# Patient Record
Sex: Female | Born: 1985 | Race: White | Hispanic: No | Smoking: Current every day smoker
Health system: Southern US, Community
[De-identification: ages and names within clinical notes are randomized; demographics above are authoritative.]

## PROBLEM LIST (undated history)

## (undated) HISTORY — PX: TUBAL LIGATION: SHX77

---

## 2017-11-04 ENCOUNTER — Emergency Department
Admission: EM | Admit: 2017-11-04 | Discharge: 2017-11-04 | Disposition: A | Discharge status: Home / Self Care | Payer: Self-pay | Attending: Emergency Medicine | Admitting: Emergency Medicine

## 2017-11-04 ENCOUNTER — Other Ambulatory Visit: Payer: Self-pay

## 2017-11-04 ENCOUNTER — Encounter: Payer: Self-pay | Admitting: Emergency Medicine

## 2017-11-04 DIAGNOSIS — K047 Periapical abscess without sinus: Secondary | ICD-10-CM | POA: Insufficient documentation

## 2017-11-04 DIAGNOSIS — F17200 Nicotine dependence, unspecified, uncomplicated: Secondary | ICD-10-CM | POA: Insufficient documentation

## 2017-11-04 MED ORDER — AMOXICILLIN 500 MG PO TABS: 500.0000 mg | ORAL_TABLET | Freq: Three times a day (TID) | ORAL | 0 refills | Status: DC

## 2017-11-04 MED ORDER — LIDOCAINE-EPINEPHRINE 2 %-1:100000 IJ SOLN
1.7000 mL | Freq: Once | INTRAMUSCULAR | Status: AC
  Administered 2017-11-04: 1.7 mL
  Filled 2017-11-04: qty 1.7

## 2017-11-04 MED ORDER — NAPROXEN 500 MG PO TABS: 500.0000 mg | ORAL_TABLET | Freq: Two times a day (BID) | ORAL | 0 refills | Status: DC

## 2017-11-04 NOTE — ED Notes (Signed)
Patient to Room 42.  VS obtained, not triaged.  Bonita QuinLinda RN aware.

## 2017-11-04 NOTE — Discharge Instructions (Signed)
Go to [facility].

## 2017-11-04 NOTE — ED Triage Notes (Signed)
Developed dental pain yesterday  States she woke up with swelling to right lower gumline

## 2017-11-04 NOTE — ED Notes (Signed)
First Nurse Note:  Patient complaining of left sided tooth pain.

## 2017-11-04 NOTE — ED Provider Notes (Signed)
[facility] Emergency Department Provider Note ____________________________________________  Time seen: Approximately 10:35 AM  I have reviewed the triage vital signs and the nursing notes.   HISTORY  Chief Complaint Dental Pain   HPI Crystal Jacobson is a 32 y.o. female who presents to the emergency department for treatment and evaluation of dental pain that started yesterday. She awakened this morning with swelling to the right lower jaw and gumline. No relief with OTC medications.  History reviewed. No pertinent past medical history.  There are no active problems to display for this patient.   History reviewed. No pertinent surgical history.  Prior to Admission medications   Medication Sig Start Date End Date Taking? Authorizing Provider  amoxicillin (AMOXIL) 500 MG tablet Take 1 tablet (500 mg total) by mouth 3 (three) times daily. 11/04/17   Jemeka Wagler, Rulon Eisenmengerari B, FNP  naproxen (NAPROSYN) 500 MG tablet Take 1 tablet (500 mg total) by mouth 2 (two) times daily with a meal. 11/04/17   Ladarian Bonczek B, FNP    Allergies Patient has no known allergies.  No family history on file.  Social History Social History   Tobacco Use  . Smoking status: Current Every Day Smoker  . Smokeless tobacco: Never Used  Substance Use Topics  . Alcohol use: No    Frequency: Never  . Drug use: Not on file    Review of Systems Constitutional: Negative for fever ENT: Positive for dental pain. Musculoskeletal: Negative for trismus  Skin: Negative for edema or erythema ____________________________________________   PHYSICAL EXAM:  VITAL SIGNS: ED Triage Vitals  Enc Vitals Group     BP 11/04/17 0813 132/84     Pulse Rate 11/04/17 0813 71     Resp 11/04/17 0813 20     Temp 11/04/17 0813 97.9 F (36.6 C)     Temp Source 11/04/17 0813 Oral     SpO2 11/04/17 0813 99 %     Weight 11/04/17 0814 180 lb (81.6 kg)     Height 11/04/17 0814 5\' 4"  (1.626 m)     Head  Circumference --      Peak Flow --      Pain Score 11/04/17 0818 7     Pain Loc --      Pain Edu? --      Excl. in GC? --     Constitutional: Alert and oriented. Well appearing and in no acute distress. Eyes: No conjunctival drainage or discharge Mouth/Throat: See periodontal exam Periodontal Exam    Respiratory: Respirations are even and unlabored. Musculoskeletal: No trismus Neurologic: Awake, alert, and oriented.  Skin:  Intact without erythema or edema Psychiatric: Affect and behavior are appropriate.  ____________________________________________   LABS (all labs ordered are listed, but only abnormal results are displayed)  Labs Reviewed - No data to display ____________________________________________   RADIOLOGY  Not indicated. ____________________________________________   PROCEDURES  Procedure(s) performed: Dental block performed by me after the patient gave verbal permission. 27 gauge needle and Lidocaine 1% with epinephrine 1.7 ML injected into the pterygomandibular space with successful anesthesia of the teeth, lip, and 1/2 of tongue. Patient tolerated the procedure well without immediate complications.  Critical Care performed: No ____________________________________________   INITIAL IMPRESSION / ASSESSMENT AND PLAN / ED COURSE  Crystal Jacobson is a 32 y.o. female who presents to the emergency department for treatment and evaluation of dental pain. Dental block performed with successful reduction in pain. She is to follow up with the dentist as soon as possible.  She was advised to return to the ER for symptoms that change or worsen if unable to schedule an appointment.  Pertinent labs & imaging results that were available during my care of the patient were reviewed by me and considered in my medical decision making (see chart for details).  ____________________________________________   FINAL CLINICAL IMPRESSION(S) / ED DIAGNOSES  Final  diagnoses:  Dental abscess    Discharge Medication List as of 11/04/2017  9:21 AM    START taking these medications   Details  amoxicillin (AMOXIL) 500 MG tablet Take 1 tablet (500 mg total) by mouth 3 (three) times daily., Starting Tue 11/04/2017, Print    naproxen (NAPROSYN) 500 MG tablet Take 1 tablet (500 mg total) by mouth 2 (two) times daily with a meal., Starting Tue 11/04/2017, Print        If controlled substance prescribed during this visit, 12 month history viewed on the [facility] prior to issuing an initial prescription for Schedule II or III opiod.  Note:  This document was prepared using Dragon voice recognition software and may include unintentional dictation errors.    Chinita Pester, FNP 11/04/17 1356    Emily Filbert, MD 11/04/17 [phone]

## 2017-11-10 ENCOUNTER — Other Ambulatory Visit: Payer: Self-pay

## 2017-11-10 ENCOUNTER — Emergency Department
Admission: EM | Admit: 2017-11-10 | Discharge: 2017-11-10 | Disposition: A | Discharge status: Home / Self Care | Payer: Self-pay | Attending: Student in an Organized Health Care Education/Training Program | Admitting: Student in an Organized Health Care Education/Training Program

## 2017-11-10 DIAGNOSIS — M25561 Pain in right knee: Secondary | ICD-10-CM | POA: Insufficient documentation

## 2017-11-10 DIAGNOSIS — Z5321 Procedure and treatment not carried out due to patient leaving prior to being seen by health care provider: Secondary | ICD-10-CM | POA: Insufficient documentation

## 2017-11-10 NOTE — ED Notes (Signed)
Called in flex lobby and main wait without answer. This is the first time pts name was called.

## 2017-11-10 NOTE — ED Triage Notes (Signed)
R knee pain x 2 days. Denies fall or injury.

## 2019-02-24 ENCOUNTER — Encounter: Payer: Self-pay | Admitting: Emergency Medicine

## 2019-02-24 ENCOUNTER — Other Ambulatory Visit: Payer: Self-pay

## 2019-02-24 DIAGNOSIS — Y939 Activity, unspecified: Secondary | ICD-10-CM | POA: Diagnosis not present

## 2019-02-24 DIAGNOSIS — Y990 Civilian activity done for income or pay: Secondary | ICD-10-CM | POA: Diagnosis not present

## 2019-02-24 DIAGNOSIS — S299XXA Unspecified injury of thorax, initial encounter: Secondary | ICD-10-CM | POA: Diagnosis present

## 2019-02-24 DIAGNOSIS — F17200 Nicotine dependence, unspecified, uncomplicated: Secondary | ICD-10-CM | POA: Diagnosis not present

## 2019-02-24 DIAGNOSIS — Y929 Unspecified place or not applicable: Secondary | ICD-10-CM | POA: Insufficient documentation

## 2019-02-24 DIAGNOSIS — W11XXXA Fall on and from ladder, initial encounter: Secondary | ICD-10-CM | POA: Insufficient documentation

## 2019-02-24 DIAGNOSIS — S20222A Contusion of left back wall of thorax, initial encounter: Secondary | ICD-10-CM | POA: Insufficient documentation

## 2019-02-24 NOTE — ED Triage Notes (Addendum)
Patient ambulatory to triage with steady gait, without difficulty or distress noted, pt reports while at work, climing up ladder to get cigarettes and fell coming down hitting mid back and rt knee on corner of counter; pt employed with [employer]--workers comp profile indicates UDS required (EDT Mayra to triage to complete)

## 2019-02-25 ENCOUNTER — Emergency Department: Payer: No Typology Code available for payment source

## 2019-02-25 ENCOUNTER — Emergency Department
Admission: EM | Admit: 2019-02-25 | Discharge: 2019-02-25 | Disposition: A | Discharge status: Home / Self Care | Payer: No Typology Code available for payment source | Attending: Emergency Medicine | Admitting: Emergency Medicine

## 2019-02-25 DIAGNOSIS — S20222A Contusion of left back wall of thorax, initial encounter: Secondary | ICD-10-CM

## 2019-02-25 LAB — POCT PREGNANCY, URINE: Preg Test, Ur: NEGATIVE

## 2019-02-25 MED ORDER — TRAMADOL HCL 50 MG PO TABS: 50.0000 mg | ORAL_TABLET | Freq: Four times a day (QID) | ORAL | 0 refills | Status: AC | PRN

## 2019-02-25 MED ORDER — OXYCODONE-ACETAMINOPHEN 5-325 MG PO TABS
ORAL_TABLET | ORAL | Status: AC
Start: 2019-02-25 — End: 2019-02-25
  Administered 2019-02-25: 03:00:00
  Filled 2019-02-25: qty 1

## 2019-02-25 MED ORDER — LIDOCAINE 5 % EX PTCH
MEDICATED_PATCH | CUTANEOUS | Status: AC
  Administered 2019-02-25: 03:00:00
  Filled 2019-02-25: qty 1

## 2019-02-25 NOTE — ED Notes (Signed)
Unable to obtain e-signature d/t e-pad not working in room at this time. Paper copy signed by patient for scanning into EMR.

## 2019-02-25 NOTE — ED Provider Notes (Signed)
[facility] Emergency Department Provider Note   First MD Initiated Contact with Patient 02/25/19 [phone]     (approximate)  I have reviewed the triage vital signs and the nursing notes.   HISTORY  Chief Complaint Back Pain   HPI Crystal Jacobson is a 33 y.o. female presents to the emergency department secondary to injury which occurred while at work.  Patient states that she was climbing up a ladder to get cigarettes when she fell striking her left side of her mid back as well as her right knee on the counter as she fell.  Patient denies any head injury no loss of consciousness.  Patient states that current pain score is 10 out of 10.  Patient states that pain is worse with movement.  Patient denies any abdominal discomfort.  Patient denies any chest pain        History reviewed. No pertinent past medical history.  There are no active problems to display for this patient.   History reviewed. No pertinent surgical history.  Prior to Admission medications   Medication Sig Start Date End Date Taking? Authorizing Provider  amoxicillin (AMOXIL) 500 MG tablet Take 1 tablet (500 mg total) by mouth 3 (three) times daily. 11/04/17   Triplett, Cari B, FNP  naproxen (NAPROSYN) 500 MG tablet Take 1 tablet (500 mg total) by mouth 2 (two) times daily with a meal. 11/04/17   Triplett, Cari B, FNP  traMADol (ULTRAM) 50 MG tablet Take 1 tablet (50 mg total) by mouth every 6 (six) hours as needed. 02/25/19 02/25/20  Darci CurrentBrown, [place] N, MD    Allergies Sulfa antibiotics  No family history on file.  Social History Social History   Tobacco Use  . Smoking status: Current Every Day Smoker  . Smokeless tobacco: Never Used  Substance Use Topics  . Alcohol use: No    Frequency: Never  . Drug use: Not on file    Review of Systems Constitutional: No fever/chills Eyes: No visual changes. ENT: No sore throat. Cardiovascular: Denies chest pain. Respiratory: Denies  shortness of breath. Gastrointestinal: No abdominal pain.  No nausea, no vomiting.  No diarrhea.  No constipation. Genitourinary: Negative for dysuria. Musculoskeletal: Negative for neck pain.  Positive for back pain. Integumentary: Negative for rash. Neurological: Negative for headaches, focal weakness or numbness.   ____________________________________________   PHYSICAL EXAM:  VITAL SIGNS: ED Triage Vitals  Enc Vitals Group     BP 02/24/19 2232 (!) 147/86     Pulse Rate 02/24/19 2232 (!) 105     Resp 02/24/19 2232 20     Temp 02/24/19 2232 98 F (36.7 C)     Temp Source 02/24/19 2232 Oral     SpO2 02/24/19 2232 97 %     Weight 02/24/19 2231 95.3 kg (210 lb)     Height 02/24/19 2231 1.626 m (5\' 4" )     Head Circumference --      Peak Flow --      Pain Score 02/24/19 2231 8     Pain Loc --      Pain Edu? --      Excl. in GC? --     Constitutional: Alert and oriented. Well appearing and in no acute distress. Eyes: Conjunctivae are normal. Head: Atraumatic. Mouth/Throat: Mucous membranes are moist.  Oropharynx non-erythematous. Neck: No stridor.   Cardiovascular: Normal rate, regular rhythm. Good peripheral circulation. Grossly normal heart sounds. Respiratory: Normal respiratory effort.  No retractions. No audible wheezing. Gastrointestinal:  Soft and nontender. No distention.  Musculoskeletal: Pain to palpation left thoracolumbar paraspinal muscles Neurologic:  Normal speech and language. No gross focal neurologic deficits are appreciated.  Skin:  Skin is warm, dry and intact. No rash noted. Psychiatric: Mood and affect are normal. Speech and behavior are normal.   RADIOLOGY I, [place] Pattye Meda, personally viewed and evaluated these images (plain radiographs) as part of my medical decision making, as well as reviewing the written report by the radiologist.  ED MD interpretation: Thoracic and lumbar spine x-rays revealed no acute abnormality per radiologist   Official radiology report(s): Dg Thoracic Spine 2 View  Result Date: 02/25/2019 CLINICAL DATA:  33 year old female status post fall with pain. EXAM: THORACIC SPINE 2 VIEWS COMPARISON:  None. FINDINGS: Hypoplastic ribs at T12, with an unfused ossification center on the left. Otherwise normal thoracic segmentation. Bone mineralization is within normal limits. Cervicothoracic junction alignment is within normal limits. Preserved thoracic kyphosis. Slight levoconvex scoliosis. Preserved vertebral body height and disc spaces. Mild endplate spurring. Posterior ribs appear intact. No acute osseous abnormality identified. Negative visible chest and abdominal visceral contours. IMPRESSION: No acute osseous abnormality identified in the thoracic spine. Electronically Signed   By: Odessa FlemingH  Hall M.D.   On: 02/25/2019 03:47   Dg Lumbar Spine Complete  Result Date: 02/25/2019 CLINICAL DATA:  33 year old female status post fall with pain. EXAM: LUMBAR SPINE - COMPLETE 4+ VIEW COMPARISON:  Thoracic radiographs today. FINDINGS: Normal lumbar segmentation. Preserved lumbar lordosis. Bone mineralization is within normal limits. No pars fracture. Preserved disc spaces. Sacral detail degraded by stool and bowel gas. No acute osseous abnormality identified. Nonobstructed visible bowel gas pattern. IMPRESSION: No acute osseous abnormality identified in the lumbar spine. Electronically Signed   By: Odessa FlemingH  Hall M.D.   On: 02/25/2019 03:48     Procedures   ____________________________________________   INITIAL IMPRESSION / MDM / ASSESSMENT AND PLAN / ED COURSE  As part of my medical decision making, I reviewed the following data within the electronic MEDICAL RECORD NUMBER  33 year old female presenting with above-stated history and physical exam secondary to accidental fall resultant left mid back injury.  X-rays performed to evaluate for skeletal trauma however none was noted.  Patient given a Percocet with Lidoderm patch applied.   On reevaluation patient's pain improved.  *Crystal Jacobson was evaluated in Emergency Department on 02/25/2019 for the symptoms described in the history of present illness. She was evaluated in the context of the global COVID-19 pandemic, which necessitated consideration that the patient might be at risk for infection with the SARS-CoV-2 virus that causes COVID-19. Institutional protocols and algorithms that pertain to the evaluation of patients at risk for COVID-19 are in a state of rapid change based on information released by regulatory bodies including the CDC and federal and state organizations. These policies and algorithms were followed during the patient's care in the ED.  Some ED evaluations and interventions may be delayed as a result of limited staffing during the pandemic.*   ____________________________________________  FINAL CLINICAL IMPRESSION(S) / ED DIAGNOSES  Final diagnoses:  Contusion of left side of back, initial encounter     MEDICATIONS GIVEN DURING THIS VISIT:  Medications  oxyCODONE-acetaminophen (PERCOCET/ROXICET) 5-325 MG per tablet (  Given During Downtime 02/25/19 0249)  lidocaine (LIDODERM) 5 % (  Patch Applied 02/25/19 0249)     ED Discharge Orders         Ordered    traMADol (ULTRAM) 50 MG tablet  Every 6 hours PRN  02/25/19 0351           Note:  This document was prepared using Dragon voice recognition software and may include unintentional dictation errors.   Gregor Hams, MD 02/25/19 [phone]

## 2020-03-19 ENCOUNTER — Encounter: Payer: Self-pay | Admitting: Emergency Medicine

## 2020-03-19 ENCOUNTER — Emergency Department
Admission: EM | Admit: 2020-03-19 | Discharge: 2020-03-19 | Disposition: A | Discharge status: Home / Self Care | Payer: Self-pay | Attending: Emergency Medicine | Admitting: Emergency Medicine

## 2020-03-19 ENCOUNTER — Other Ambulatory Visit: Payer: Self-pay

## 2020-03-19 DIAGNOSIS — N764 Abscess of vulva: Secondary | ICD-10-CM | POA: Insufficient documentation

## 2020-03-19 DIAGNOSIS — F17210 Nicotine dependence, cigarettes, uncomplicated: Secondary | ICD-10-CM | POA: Insufficient documentation

## 2020-03-19 MED ORDER — CEPHALEXIN 500 MG PO CAPS: 500.0000 mg | ORAL_CAPSULE | Freq: Three times a day (TID) | ORAL | 0 refills | Status: AC

## 2020-03-19 MED ORDER — CEPHALEXIN 500 MG PO CAPS
500.0000 mg | ORAL_CAPSULE | Freq: Once | ORAL | Status: AC
  Administered 2020-03-19: 500 mg via ORAL
  Filled 2020-03-19: qty 1

## 2020-03-19 MED ORDER — LIDOCAINE HCL (PF) 1 % IJ SOLN
5.0000 mL | Freq: Once | INTRAMUSCULAR | Status: AC
  Administered 2020-03-19: 5 mL
  Filled 2020-03-19: qty 5

## 2020-03-19 MED ORDER — BACITRACIN-NEOMYCIN-POLYMYXIN 400-5-5000 EX OINT
TOPICAL_OINTMENT | Freq: Once | CUTANEOUS | Status: AC
  Administered 2020-03-19: 1 via TOPICAL
  Filled 2020-03-19: qty 2

## 2020-03-19 MED ORDER — HYDROCODONE-ACETAMINOPHEN 5-325 MG PO TABS
1.0000 | ORAL_TABLET | Freq: Once | ORAL | Status: AC
  Administered 2020-03-19: 1 via ORAL
  Filled 2020-03-19: qty 1

## 2020-03-19 NOTE — Discharge Instructions (Signed)
You were seen today for labial abscess.  This was incised and drained.  I have given you a prescription for antibiotics to take every 8 hours for the next 10 days.  You may apply warm compress or sit in a hot bath to help encourage drainage and decrease discomfort.  Monitor for increased pain, redness, swelling, fever, chills or nausea.

## 2020-03-19 NOTE — ED Provider Notes (Addendum)
[facility] Emergency Department Provider Note ____________________________________________  Time seen: 1315  I have reviewed the triage vital signs and the nursing notes.  HISTORY  Chief Complaint  Abscess   HPI Crystal Jacobson is a 34 y.o. female presents to the ER today with complaint of abscess of her left labia.  She reports this started 4 days ago.  She reports the area is red, swollen and tender to touch.  She has not noticed any drainage or odor from the area.  She denies fever, chills or nausea.  She has soap to the area in a warm tub with minimal relief of symptoms.  She reports history of abscesses in her bilateral groins.  She reports history of labial abscess in 2005 that had to be I&D.  History reviewed. No pertinent past medical history.  There are no problems to display for this patient.   Past Surgical History:  Procedure Laterality Date  . TUBAL LIGATION      Prior to Admission medications   Medication Sig Start Date End Date Taking? Authorizing Provider  amoxicillin (AMOXIL) 500 MG tablet Take 1 tablet (500 mg total) by mouth 3 (three) times daily. 11/04/17   Triplett, Cari B, FNP  cephALEXin (KEFLEX) 500 MG capsule Take 1 capsule (500 mg total) by mouth 3 (three) times daily for 10 days. 03/19/20 03/29/20  Lorre Munroe, NP  naproxen (NAPROSYN) 500 MG tablet Take 1 tablet (500 mg total) by mouth 2 (two) times daily with a meal. 11/04/17   Triplett, Cari B, FNP    Allergies Sulfa antibiotics  No family history on file.  Social History Social History   Tobacco Use  . Smoking status: Current Every Day Smoker    Packs/day: 0.50    Types: Cigarettes  . Smokeless tobacco: Never Used  Substance Use Topics  . Alcohol use: No  . Drug use: Not on file    Review of Systems  Constitutional: Negative for fever, chills or body aches. Cardiovascular: Negative for chest pain or chest tightness. Respiratory: Negative for cough or shortness  of breath. Skin: Positive for abscess to left labia. Neurological: Negative for focal weakness. tingling or numbness. ____________________________________________  PHYSICAL EXAM:  VITAL SIGNS: ED Triage Vitals [03/19/20 1235]  Enc Vitals Group     BP (!) 132/92     Pulse Rate (!) 102     Resp 20     Temp 97.8 F (36.6 C)     Temp Source Oral     SpO2 97 %     Weight 232 lb (105.2 kg)     Height 5\' 4"  (1.626 m)     Head Circumference      Peak Flow      Pain Score 10     Pain Loc      Pain Edu?      Excl. in GC?     Constitutional: Alert and oriented. Appears in pain, but NAD. Cardiovascular: Tachycardic, regular rhythm.  Respiratory: Normal respiratory effort.  Gastrointestinal: Soft and nontender. No distention. Pelvic: 1 cm abscess noted of left labia majora, with mild surrounding cellulitis. No drainage noted. Neurologic:  Normal gait without ataxia. Normal speech and language. No gross focal neurologic deficits are appreciated. ____________________________________________   PROCEDURES  . Incision and Drainage  Date/Time: 03/19/2020 2:04 PM Performed by: 05/20/2020, NP Authorized by: Lorre Munroe, NP   Consent:    Consent obtained:  Verbal   Consent given by:  Patient  Risks discussed:  Bleeding, incomplete drainage, pain and infection   Alternatives discussed:  Alternative treatment Location:    Type:  Abscess   Location:  Anogenital   Anogenital location: labia. Pre-procedure details:    Skin preparation:  Betadine Anesthesia (see MAR for exact dosages):    Anesthesia method:  Local infiltration   Local anesthetic:  Lidocaine 1% w/o epi Procedure type:    Complexity:  Simple Procedure details:    Needle aspiration: no     Incision types:  Single straight   Incision depth:  Submucosal   Scalpel blade:  11   Wound management:  Irrigated with saline   Drainage:  Bloody and purulent   Drainage amount:  Scant   Wound treatment:  Wound left  open   Packing materials:  None Post-procedure details:    Patient tolerance of procedure:  Tolerated well, no immediate complications   ____________________________________________  INITIAL IMPRESSION / ASSESSMENT AND PLAN / ED COURSE  Labial Abscess, Left:  Norco 5-325 mg PO x 1 Keflex 500 mg PO x 1 I&D'd, see procedure note RX for Keflex 500 mg TID x 10 days     I reviewed the patient's prescription history over the last 12 months in the multi-state controlled substances database(s) that includes [place], [place], [place], [place], [place], [place], [place], [place], [place], [place], [place], [place], [place], and [place].  Results were notable for no recent controlled substances ____________________________________________  FINAL CLINICAL IMPRESSION(S) / ED DIAGNOSES  Final diagnoses:  Labial abscess      Lorre Munroe, NP 03/19/20 1409    Lorre Munroe, NP 03/19/20 1848    Concha Se, MD 03/20/20 1101

## 2020-03-19 NOTE — ED Triage Notes (Signed)
Pt presents to ED via POV with c/o abscess to L labia. Pt states abscess x 4 days with hx of same.

## 2020-05-11 ENCOUNTER — Emergency Department: Payer: HRSA Program

## 2020-05-11 ENCOUNTER — Other Ambulatory Visit: Payer: Self-pay

## 2020-05-11 ENCOUNTER — Emergency Department
Admission: EM | Admit: 2020-05-11 | Discharge: 2020-05-11 | Disposition: A | Discharge status: Home / Self Care | Payer: HRSA Program | Attending: Emergency Medicine | Admitting: Emergency Medicine

## 2020-05-11 DIAGNOSIS — R05 Cough: Secondary | ICD-10-CM | POA: Diagnosis present

## 2020-05-11 DIAGNOSIS — U071 COVID-19: Secondary | ICD-10-CM | POA: Diagnosis not present

## 2020-05-11 DIAGNOSIS — Z5321 Procedure and treatment not carried out due to patient leaving prior to being seen by health care provider: Secondary | ICD-10-CM | POA: Insufficient documentation

## 2020-05-11 LAB — URINALYSIS, COMPLETE (UACMP) WITH MICROSCOPIC
Bacteria, UA: NONE SEEN
Bilirubin Urine: NEGATIVE
Glucose, UA: NEGATIVE mg/dL
Hgb urine dipstick: NEGATIVE
Ketones, ur: NEGATIVE mg/dL
Leukocytes,Ua: NEGATIVE
Nitrite: NEGATIVE
Protein, ur: 30 mg/dL — AB
Specific Gravity, Urine: 1.031 — ABNORMAL HIGH (ref 1.005–1.030)
pH: 5 (ref 5.0–8.0)

## 2020-05-11 LAB — COMPREHENSIVE METABOLIC PANEL
ALT: 61 U/L — ABNORMAL HIGH (ref 0–44)
AST: 57 U/L — ABNORMAL HIGH (ref 15–41)
Albumin: 3.8 g/dL (ref 3.5–5.0)
Alkaline Phosphatase: 68 U/L (ref 38–126)
Anion gap: 11 (ref 5–15)
BUN: 15 mg/dL (ref 6–20)
CO2: 23 mmol/L (ref 22–32)
Calcium: 9 mg/dL (ref 8.9–10.3)
Chloride: 107 mmol/L (ref 98–111)
Creatinine, Ser: 0.45 mg/dL (ref 0.44–1.00)
GFR calc Af Amer: >60 mL/min (ref >60)
GFR calc non Af Amer: >60 mL/min (ref >60)
Glucose, Bld: 98 mg/dL (ref 70–99)
Potassium: 3.4 mmol/L — ABNORMAL LOW (ref 3.5–5.1)
Sodium: 141 mmol/L (ref 135–145)
Total Bilirubin: 0.5 mg/dL (ref 0.3–1.2)
Total Protein: 7.7 g/dL (ref 6.5–8.1)

## 2020-05-11 LAB — CBC WITH DIFFERENTIAL/PLATELET
Abs Immature Granulocytes: 0.02 10*3/uL (ref 0.00–0.07)
Basophils Absolute: 0 10*3/uL (ref 0.0–0.1)
Basophils Relative: 0 %
Eosinophils Absolute: 0.1 10*3/uL (ref 0.0–0.5)
Eosinophils Relative: 2 %
HCT: 38.4 % (ref 36.0–46.0)
Hemoglobin: 12.6 g/dL (ref 12.0–15.0)
Immature Granulocytes: 0 %
Lymphocytes Relative: 36 %
Lymphs Abs: 2.6 10*3/uL (ref 0.7–4.0)
MCH: 29.6 pg (ref 26.0–34.0)
MCHC: 32.8 g/dL (ref 30.0–36.0)
MCV: 90.1 fL (ref 80.0–100.0)
Monocytes Absolute: 0.5 10*3/uL (ref 0.1–1.0)
Monocytes Relative: 7 %
Neutro Abs: 3.9 10*3/uL (ref 1.7–7.7)
Neutrophils Relative %: 55 %
Platelets: 322 10*3/uL (ref 150–400)
RBC: 4.26 MIL/uL (ref 3.87–5.11)
RDW: 13.3 % (ref 11.5–15.5)
WBC: 7.2 10*3/uL (ref 4.0–10.5)
nRBC: 0 % (ref 0.0–0.2)

## 2020-05-11 LAB — POCT PREGNANCY, URINE: Preg Test, Ur: NEGATIVE

## 2020-05-11 LAB — LIPASE, BLOOD: Lipase: 24 U/L (ref 11–51)

## 2020-05-11 NOTE — ED Triage Notes (Signed)
Patient c/o N/V, dizziness, cough. Patient's coworkers tested positive for COVID.

## 2020-05-15 ENCOUNTER — Telehealth: Payer: Self-pay | Admitting: Emergency Medicine

## 2020-05-15 NOTE — Telephone Encounter (Signed)
Called patient due to lwot to inquire about condition and follow up plans.  Left message to call me.

## 2020-12-08 ENCOUNTER — Encounter: Payer: Self-pay | Admitting: Emergency Medicine

## 2020-12-08 ENCOUNTER — Emergency Department
Admission: EM | Admit: 2020-12-08 | Discharge: 2020-12-08 | Disposition: A | Discharge status: Home / Self Care | Payer: Medicaid Other | Attending: Emergency Medicine | Admitting: Emergency Medicine

## 2020-12-08 ENCOUNTER — Emergency Department: Payer: Medicaid Other

## 2020-12-08 ENCOUNTER — Other Ambulatory Visit: Payer: Self-pay

## 2020-12-08 DIAGNOSIS — F17210 Nicotine dependence, cigarettes, uncomplicated: Secondary | ICD-10-CM | POA: Insufficient documentation

## 2020-12-08 DIAGNOSIS — N12 Tubulo-interstitial nephritis, not specified as acute or chronic: Secondary | ICD-10-CM | POA: Insufficient documentation

## 2020-12-08 DIAGNOSIS — Z20822 Contact with and (suspected) exposure to covid-19: Secondary | ICD-10-CM | POA: Insufficient documentation

## 2020-12-08 LAB — BASIC METABOLIC PANEL
Anion gap: 7 (ref 5–15)
BUN: 9 mg/dL (ref 6–20)
CO2: 22 mmol/L (ref 22–32)
Calcium: 9.2 mg/dL (ref 8.9–10.3)
Chloride: 107 mmol/L (ref 98–111)
Creatinine, Ser: 0.63 mg/dL (ref 0.44–1.00)
GFR, Estimated: >60 mL/min (ref >60)
Glucose, Bld: 109 mg/dL — ABNORMAL HIGH (ref 70–99)
Potassium: 3.9 mmol/L (ref 3.5–5.1)
Sodium: 136 mmol/L (ref 135–145)

## 2020-12-08 LAB — CBC
HCT: 38.6 % (ref 36.0–46.0)
Hemoglobin: 12.7 g/dL (ref 12.0–15.0)
MCH: 29.9 pg (ref 26.0–34.0)
MCHC: 32.9 g/dL (ref 30.0–36.0)
MCV: 90.8 fL (ref 80.0–100.0)
Platelets: 248 10*3/uL (ref 150–400)
RBC: 4.25 MIL/uL (ref 3.87–5.11)
RDW: 13.7 % (ref 11.5–15.5)
WBC: 10.4 10*3/uL (ref 4.0–10.5)
nRBC: 0 % (ref 0.0–0.2)

## 2020-12-08 LAB — LIPASE, BLOOD: Lipase: 31 U/L (ref 11–51)

## 2020-12-08 LAB — URINALYSIS, COMPLETE (UACMP) WITH MICROSCOPIC
Bilirubin Urine: NEGATIVE
Glucose, UA: NEGATIVE mg/dL
Hgb urine dipstick: NEGATIVE
Ketones, ur: 5 mg/dL — AB
Nitrite: POSITIVE — AB
Protein, ur: 30 mg/dL — AB
Specific Gravity, Urine: 1.021 (ref 1.005–1.030)
WBC, UA: >50 WBC/hpf — ABNORMAL HIGH (ref 0–5)
pH: 5 (ref 5.0–8.0)

## 2020-12-08 LAB — POC URINE PREG, ED: Preg Test, Ur: NEGATIVE

## 2020-12-08 LAB — HEPATIC FUNCTION PANEL
ALT: 51 U/L — ABNORMAL HIGH (ref 0–44)
AST: 47 U/L — ABNORMAL HIGH (ref 15–41)
Albumin: 3.7 g/dL (ref 3.5–5.0)
Alkaline Phosphatase: 71 U/L (ref 38–126)
Bilirubin, Direct: 0.1 mg/dL (ref 0.0–0.2)
Indirect Bilirubin: 0.8 mg/dL (ref 0.3–0.9)
Total Bilirubin: 0.9 mg/dL (ref 0.3–1.2)
Total Protein: 7.8 g/dL (ref 6.5–8.1)

## 2020-12-08 LAB — RESP PANEL BY RT-PCR (FLU A&B, COVID) ARPGX2
Influenza A by PCR: NEGATIVE
Influenza B by PCR: NEGATIVE
SARS Coronavirus 2 by RT PCR: NEGATIVE

## 2020-12-08 LAB — HCG, QUANTITATIVE, PREGNANCY: hCG, Beta Chain, Quant, S: <1 m[IU]/mL (ref <5)

## 2020-12-08 MED ORDER — KETOROLAC TROMETHAMINE 30 MG/ML IJ SOLN
15.0000 mg | Freq: Once | INTRAMUSCULAR | Status: AC
  Administered 2020-12-08: 15 mg via INTRAVENOUS
  Filled 2020-12-08: qty 1

## 2020-12-08 MED ORDER — ONDANSETRON HCL 4 MG/2ML IJ SOLN
4.0000 mg | Freq: Once | INTRAMUSCULAR | Status: AC
  Administered 2020-12-08: 4 mg via INTRAVENOUS
  Filled 2020-12-08: qty 2

## 2020-12-08 MED ORDER — ONDANSETRON 4 MG PO TBDP
4.0000 mg | ORAL_TABLET | Freq: Three times a day (TID) | ORAL | 0 refills | Status: DC | PRN
Start: 2020-12-08 — End: 2021-08-30

## 2020-12-08 MED ORDER — CEFDINIR 300 MG PO CAPS: 300.0000 mg | ORAL_CAPSULE | Freq: Two times a day (BID) | ORAL | 0 refills | Status: AC

## 2020-12-08 MED ORDER — HYDROMORPHONE HCL 1 MG/ML IJ SOLN
0.5000 mg | Freq: Once | INTRAMUSCULAR | Status: AC
  Administered 2020-12-08: 0.5 mg via INTRAVENOUS
  Filled 2020-12-08: qty 1

## 2020-12-08 MED ORDER — SODIUM CHLORIDE 0.9 % IV BOLUS
1000.0000 mL | Freq: Once | INTRAVENOUS | Status: AC
  Administered 2020-12-08: 1000 mL via INTRAVENOUS

## 2020-12-08 MED ORDER — OXYCODONE HCL 5 MG PO TABS
5.0000 mg | ORAL_TABLET | Freq: Once | ORAL | Status: AC
  Administered 2020-12-08: 5 mg via ORAL
  Filled 2020-12-08: qty 1

## 2020-12-08 MED ORDER — SODIUM CHLORIDE 0.9 % IV SOLN
1.0000 g | Freq: Once | INTRAVENOUS | Status: AC
  Administered 2020-12-08: 1 g via INTRAVENOUS
  Filled 2020-12-08: qty 10

## 2020-12-08 MED ORDER — IOHEXOL 300 MG/ML  SOLN
125.0000 mL | Freq: Once | INTRAMUSCULAR | Status: AC | PRN
  Administered 2020-12-08: 125 mL via INTRAVENOUS

## 2020-12-08 NOTE — Discharge Instructions (Addendum)
Take the antibiotics to treat the pyelonephritis and Zofran to help with nausea.  Take Tylenol 1 g every 8 hours to help with pain  You should follow-up in with your PCP in 2 to 3 days for urine culture check  Return to the ER for worsening symptoms or any other concern   IMPRESSION:  Findings consistent with right sided pyelonephritis. No evidence of  abscess or hydronephrosis.

## 2020-12-08 NOTE — ED Provider Notes (Signed)
[facility] Emergency Department Provider Note  ____________________________________________   Event Date/Time   First MD Initiated Contact with Patient 12/08/20 1054     (approximate)  I have reviewed the triage vital signs and the nursing notes.   HISTORY  Chief Complaint Abdominal pain.  HPI Crystal Jacobson is a 35 y.o. female who has had prior tubal ligation comes in for abdominal pain.  Patient states about a week ago she developed a foul-smelling urine.  She took some medicine from her mother-in-law that turned her urine orange.  She is not sure what it was called.  She then developed some severe pain in her back as well as her right lower quadrant that is constant, nothing makes it better, nothing makes it worse.  Denies any blood in her urine.           History reviewed. No pertinent past medical history.  There are no problems to display for this patient.   Past Surgical History:  Procedure Laterality Date  . TUBAL LIGATION      Prior to Admission medications   Medication Sig Start Date End Date Taking? Authorizing Provider  amoxicillin (AMOXIL) 500 MG tablet Take 1 tablet (500 mg total) by mouth 3 (three) times daily. 11/04/17   Triplett, Rulon Eisenmenger B, FNP  naproxen (NAPROSYN) 500 MG tablet Take 1 tablet (500 mg total) by mouth 2 (two) times daily with a meal. 11/04/17   Triplett, Cari B, FNP    Allergies Sulfa antibiotics  No family history on file.  Social History Social History   Tobacco Use  . Smoking status: Current Every Day Smoker    Packs/day: 0.50    Types: Cigarettes  . Smokeless tobacco: Never Used  Substance Use Topics  . Alcohol use: No      Review of Systems Constitutional: No fever/chills Eyes: No visual changes. ENT: No sore throat. Cardiovascular: Denies chest pain. Respiratory: Denies shortness of breath. Gastrointestinal: Abdominal pain.  Positive nausea no vomiting.  No diarrhea.  No  constipation. Genitourinary: Negative for dysuria. Musculoskeletal: Positive back pain. Skin: Negative for rash. Neurological: Negative for headaches, focal weakness or numbness. All other ROS negative ____________________________________________   PHYSICAL EXAM:  VITAL SIGNS: ED Triage Vitals  Enc Vitals Group     BP 12/08/20 1050 (!) 144/95     Pulse Rate 12/08/20 1050 100     Resp 12/08/20 1050 20     Temp 12/08/20 1050 99 F (37.2 C)     Temp Source 12/08/20 1050 Oral     SpO2 12/08/20 1050 97 %     Weight 12/08/20 1040 229 lb 4.5 oz (104 kg)     Height 12/08/20 1040 5\' 4"  (1.626 m)     Head Circumference --      Peak Flow --      Pain Score 12/08/20 1039 8     Pain Loc --      Pain Edu? --      Excl. in [place]? --     Constitutional: Alert and oriented.  Appears in pain. Eyes: Conjunctivae are normal. EOMI. Head: Atraumatic. Nose: No congestion/rhinnorhea. Mouth/Throat: Mucous membranes are moist.   Neck: No stridor. Trachea Midline. FROM Cardiovascular: Tachycardic, regular rhythm. Grossly normal heart sounds.  Good peripheral circulation. Respiratory: Normal respiratory effort.  No retractions. Lungs CTAB. Gastrointestinal: Tender in the right lower quadrant. No distention. No abdominal bruits.  Musculoskeletal: No lower extremity tenderness nor edema.  No joint effusions. Neurologic:  Normal speech  and language. No gross focal neurologic deficits are appreciated.  Skin:  Skin is warm, dry and intact. No rash noted. Psychiatric: Mood and affect are normal. Speech and behavior are normal. GU: Deferred   ____________________________________________   LABS (all labs ordered are listed, but only abnormal results are displayed)  Labs Reviewed  URINALYSIS, COMPLETE (UACMP) WITH MICROSCOPIC - Abnormal; Notable for the following components:      Result Value   Color, Urine AMBER (*)    APPearance CLOUDY (*)    Ketones, ur 5 (*)    Protein, ur 30 (*)    Nitrite  POSITIVE (*)    Leukocytes,Ua SMALL (*)    WBC, UA >50 (*)    Bacteria, UA FEW (*)    All other components within normal limits  BASIC METABOLIC PANEL - Abnormal; Notable for the following components:   Glucose, Bld 109 (*)    All other components within normal limits  HEPATIC FUNCTION PANEL - Abnormal; Notable for the following components:   AST 47 (*)    ALT 51 (*)    All other components within normal limits  RESP PANEL BY RT-PCR (FLU A&B, COVID) ARPGX2  CBC  LIPASE, BLOOD  HCG, QUANTITATIVE, PREGNANCY  POC URINE PREG, ED   ____________________________________________     RADIOLOGY   Official radiology report(s): CT ABDOMEN PELVIS W CONTRAST  Result Date: 12/08/2020 CLINICAL DATA:  Right lower quadrant and right-sided back pain for 3 days. Nausea. Fever. EXAM: CT ABDOMEN AND PELVIS WITH CONTRAST TECHNIQUE: Multidetector CT imaging of the abdomen and pelvis was performed using the standard protocol following bolus administration of intravenous contrast. CONTRAST:  OMNIPAQUE IOHEXOL 300 MG/ML  SOLN COMPARISON:  None. FINDINGS: Lower Chest: No acute findings. Hepatobiliary: No hepatic masses identified. Gallbladder is unremarkable. No evidence of biliary ductal dilatation. Pancreas:  No mass or inflammatory changes. Spleen: Within normal limits in size. 1.4 cm splenic cyst incidentally noted. Adrenals/Urinary Tract: Focal ill-defined area of decreased enhancement is seen in the lateral midpole of the right kidney with mild right perinephric soft tissue stranding. This is consistent with pyelonephritis. No evidence of abscess. No evidence of ureteral calculi or hydronephrosis. Stomach/Bowel: No evidence of obstruction, inflammatory process or abnormal fluid collections. Vascular/Lymphatic: No pathologically enlarged lymph nodes. No abdominal aortic aneurysm. Reproductive:  No mass or other significant abnormality. Other:  None. Musculoskeletal:  No suspicious bone lesions  identified. IMPRESSION: Findings consistent with right sided pyelonephritis. No evidence of abscess or hydronephrosis. Electronically Signed   By: Danae Orleans M.D.   On: 12/08/2020 13:01    ____________________________________________   PROCEDURES  Procedure(s) performed (including Critical Care):  Procedures   ____________________________________________   INITIAL IMPRESSION / ASSESSMENT AND PLAN / ED COURSE  Crystal Jacobson was evaluated in Emergency Department on 12/08/2020 for the symptoms described in the history of present illness. She was evaluated in the context of the global COVID-19 pandemic, which necessitated consideration that the patient might be at risk for infection with the SARS-CoV-2 virus that causes COVID-19. Institutional protocols and algorithms that pertain to the evaluation of patients at risk for COVID-19 are in a state of rapid change based on information released by regulatory bodies including the CDC and federal and state organizations. These policies and algorithms were followed during the patient's care in the ED.     Patient is a 35 year old with low-grade temperatures and tachycardia who comes in with abdominal pain and back pain.  Suspect this most likely is a kidney stone  given she is tender in her right lower quadrant consider the possibility of appendicitis and will proceed with CT with contrast to further evaluate.  Also get urine to evaluate for infected stone versus pyelonephritis.  Will get pregnancy test to make sure no signs of ectopic given patient had prior tubal ligation.  We will keep patient on the cardiac monitor given her tachycardia give patient a liter of fluids, Zofran and IV Dilaudid while awaiting CT imaging  Pregnancy test was negative  White count is normal patient does not meet sepsis criteria.  Urine consistent with UTI CT scan confirms pyelonephritis.  Patient has received fluid, pain medication and nausea medicine.  She is  tolerating p.o.  Patient is got a dose of ceftriaxone.  We will send patient home on antibiotics and she will follow up with her PCP for urine culture.  We discussed Tylenol to help with pain      ____________________________________________   FINAL CLINICAL IMPRESSION(S) / ED DIAGNOSES   Final diagnoses:  Pyelonephritis      MEDICATIONS GIVEN DURING THIS VISIT:  Medications  sodium chloride 0.9 % bolus 1,000 mL (0 mLs Intravenous Stopped 12/08/20 1355)  HYDROmorphone (DILAUDID) injection 0.5 mg (0.5 mg Intravenous Given 12/08/20 1128)  ondansetron (ZOFRAN) injection 4 mg (4 mg Intravenous Given 12/08/20 1128)  iohexol (OMNIPAQUE) 300 MG/ML solution 125 mL (125 mLs Intravenous Contrast Given 12/08/20 1223)  cefTRIAXone (ROCEPHIN) 1 g in sodium chloride 0.9 % 100 mL IVPB (0 g Intravenous Stopped 12/08/20 1354)  ketorolac (TORADOL) 30 MG/ML injection 15 mg (15 mg Intravenous Given 12/08/20 1312)  oxyCODONE (Oxy IR/ROXICODONE) immediate release tablet 5 mg (5 mg Oral Given 12/08/20 1312)     ED Discharge Orders         Ordered    cefdinir (OMNICEF) 300 MG capsule  2 times daily        12/08/20 1458    ondansetron (ZOFRAN ODT) 4 MG disintegrating tablet  Every 8 hours PRN        12/08/20 1458           Note:  This document was prepared using Dragon voice recognition software and may include unintentional dictation errors.   Concha Se, MD 12/08/20 [phone]

## 2020-12-08 NOTE — ED Triage Notes (Signed)
First Nurse Note:  Arrives c/o right lower back pain radiating to RLQ x 3 days.  Also c/o decreased urine output and nausea.  AAOx3.  Skin pale.  Ambulates with easy and steady gait.

## 2020-12-11 LAB — URINE CULTURE: Culture: >=100000 — AB

## 2021-08-30 ENCOUNTER — Emergency Department
Admission: EM | Admit: 2021-08-30 | Discharge: 2021-08-30 | Disposition: A | Discharge status: Home / Self Care | Payer: Medicaid Other | Attending: Student in an Organized Health Care Education/Training Program | Admitting: Student in an Organized Health Care Education/Training Program

## 2021-08-30 ENCOUNTER — Encounter: Payer: Self-pay | Admitting: Emergency Medicine

## 2021-08-30 ENCOUNTER — Other Ambulatory Visit: Payer: Self-pay

## 2021-08-30 DIAGNOSIS — F17210 Nicotine dependence, cigarettes, uncomplicated: Secondary | ICD-10-CM | POA: Insufficient documentation

## 2021-08-30 DIAGNOSIS — H66002 Acute suppurative otitis media without spontaneous rupture of ear drum, left ear: Secondary | ICD-10-CM | POA: Insufficient documentation

## 2021-08-30 DIAGNOSIS — R059 Cough, unspecified: Secondary | ICD-10-CM | POA: Insufficient documentation

## 2021-08-30 DIAGNOSIS — R0981 Nasal congestion: Secondary | ICD-10-CM | POA: Insufficient documentation

## 2021-08-30 DIAGNOSIS — Z20822 Contact with and (suspected) exposure to covid-19: Secondary | ICD-10-CM | POA: Insufficient documentation

## 2021-08-30 LAB — RESP PANEL BY RT-PCR (FLU A&B, COVID) ARPGX2
Influenza A by PCR: NEGATIVE
Influenza B by PCR: NEGATIVE
SARS Coronavirus 2 by RT PCR: NEGATIVE

## 2021-08-30 MED ORDER — CEFDINIR 300 MG PO CAPS: 300.0000 mg | ORAL_CAPSULE | Freq: Two times a day (BID) | ORAL | 0 refills | Status: AC

## 2021-08-30 NOTE — Discharge Instructions (Signed)
Follow-up with your primary care provider or urgent care if any continued problems or concerns.  Drink lots of fluids.  Tylenol or ibuprofen as needed for fever and ear pain.  A prescription for Omnicef was sent to your pharmacy to take twice a day for the next 10 days.

## 2021-08-30 NOTE — ED Provider Notes (Signed)
[facility] Emergency Department Provider Note   ____________________________________________   Event Date/Time   First MD Initiated Contact with Patient 08/30/21 [phone]     (approximate)  I have reviewed the triage vital signs and the nursing notes.   HISTORY  Chief Complaint Fever, Cough, Chills, and Otalgia   HPI Crystal Jacobson is a 35 y.o. female Crystal Jacobson to the ED with complaint of cough, fever, congestion and beginning this morning left ear pain.  Patient states she took a COVID test at home which was negative.  Patient has been vaccinated for COVID with [employer].  Patient is a smoker but denies any history of asthma or bronchitis.  Currently she rates her pain as a 10/10.       History reviewed. No pertinent past medical history.  There are no problems to display for this patient.   Past Surgical History:  Procedure Laterality Date   TUBAL LIGATION      Prior to Admission medications   Medication Sig Start Date End Date Taking? Authorizing Provider  cefdinir (OMNICEF) 300 MG capsule Take 1 capsule (300 mg total) by mouth 2 (two) times daily for 10 days. 08/30/21 09/09/21 Yes Niza Soderholm L, PA-C  ibuprofen (ADVIL) 200 MG tablet Take 200 mg by mouth every 6 (six) hours as needed for headache, fever or mild pain.    [provider]  Phenazopyridine HCl (AZO DINE PO) Take 1 tablet by mouth daily.    [provider]    Allergies Sulfa antibiotics  History reviewed. No pertinent family history.  Social History Social History   Tobacco Use   Smoking status: Every Day    Packs/day: 0.50    Types: Cigarettes   Smokeless tobacco: Never  Substance Use Topics   Alcohol use: No    Review of Systems Constitutional: Positive fever/chills Eyes: No visual changes. ENT: Positive sore throat.  Positive left ear pain. Cardiovascular: Denies chest pain. Respiratory: Denies shortness of breath. Gastrointestinal: No  abdominal pain.  No nausea, no vomiting.  No diarrhea.   Genitourinary: Negative for dysuria. Musculoskeletal: Positive for muscle aches. Skin: Negative for rash. Neurological: Negative for headaches, focal weakness or numbness.  ____________________________________________   PHYSICAL EXAM:  VITAL SIGNS: ED Triage Vitals  Enc Vitals Group     BP 08/30/21 0900 (!) 120/92     Pulse Rate 08/30/21 0900 87     Resp --      Temp --      Temp src --      SpO2 08/30/21 0900 98 %     Weight 08/30/21 0843 229 lb 4.5 oz (104 kg)     Height 08/30/21 0843 5\' 4"  (1.626 m)     Head Circumference --      Peak Flow --      Pain Score 08/30/21 0843 10     Pain Loc --      Pain Edu? --      Excl. in [place]? --     Constitutional: Alert and oriented. Well appearing and in no acute distress. Eyes: Conjunctivae are normal.  Head: Atraumatic. Nose: No congestion/rhinnorhea. Ears: Left EAC is clear, left TM red with poor light reflex.  Right TM is dull. Mouth/Throat: Mucous membranes are moist.  Oropharynx non-erythematous.  No exudate.  Uvula is midline. Neck: No stridor.   Hematological/Lymphatic/Immunilogical: No cervical lymphadenopathy. Cardiovascular: Normal rate, regular rhythm. Grossly normal heart sounds.  Good peripheral circulation. Respiratory: Normal respiratory effort.  No retractions.  Lungs CTAB. Gastrointestinal: Soft and nontender. No distention.  Musculoskeletal: Moves upper and lower extremities they have difficulty.  Normal gait was noted. Neurologic:  Normal speech and language. No gross focal neurologic deficits are appreciated.  Skin:  Skin is warm, dry and intact. No rash noted. Psychiatric: Mood and affect are normal. Speech and behavior are normal.  ____________________________________________   LABS (all labs ordered are listed, but only abnormal results are displayed)  Labs Reviewed  RESP PANEL BY RT-PCR (FLU A&B, COVID) ARPGX2    ____________________________________________  PROCEDURES  Procedure(s) performed (including Critical Care):  Procedures   ____________________________________________   INITIAL IMPRESSION / ASSESSMENT AND PLAN / ED COURSE  As part of my medical decision making, I reviewed the following data within the electronic MEDICAL RECORD NUMBER Notes from prior ED visits and [place] Controlled Substance Database  35 year old female presents to the ED with complaint of cough, chills, fever and left ear pain that started 3 days ago.  Patient states she took a COVID test at home which was negative.  Physical exam is positive for a left otitis media and patient was made aware.  Her respiratory panel was negative for both COVID and influenza.  A prescription for Omnicef was sent to the pharmacy to begin taking twice a day.  She is encouraged take Tylenol or ibuprofen as needed for pain, body aches or fever.  She is to follow-up with her PCP or urgent care if any continued problems.  ____________________________________________   FINAL CLINICAL IMPRESSION(S) / ED DIAGNOSES  Final diagnoses:  Non-recurrent acute suppurative otitis media of left ear without spontaneous rupture of tympanic membrane     ED Discharge Orders          Ordered    cefdinir (OMNICEF) 300 MG capsule  2 times daily        08/30/21 1009             Note:  This document was prepared using Dragon voice recognition software and may include unintentional dictation errors.    Tommi Rumps, PA-C 08/30/21 1013    Willy Eddy, MD 08/30/21 1345

## 2021-08-30 NOTE — ED Triage Notes (Signed)
Pt comes into the ED via POV c/o cough, chills, fever, and ear pain that started Monday.  Pt states she took an at home COVID test which came back negative.  Pt has even and unlabored respirations and in NAD.

## 2021-08-30 NOTE — ED Notes (Signed)
Pt with c/o congestion and fever since Monday that has not gotten better. Pt with dry cough. RR even and unlabored.

## 2022-04-25 IMAGING — CT CT ABD-PELV W/ CM
2 of 4 series · 17 of 46 positions shown, 19 images · IV contrast (APPLIED)
Comparison: None.

CLINICAL DATA: Right lower quadrant and right-sided back pain for 3
days. Nausea. Fever.

EXAM:
CT ABDOMEN AND PELVIS WITH CONTRAST
TECHNIQUE: Multidetector CT imaging of the abdomen and pelvis was performed
using the standard protocol following bolus administration of
intravenous contrast.
CONTRAST:  125mL OMNIPAQUE IOHEXOL 300 MG/ML  SOLN

[Series 2: axial st · axial · 0.68mm/px · z∈[-1251,-821]mm · 14 of 94 slices shown, 16 images]
[im 4/94  soft-tissue]
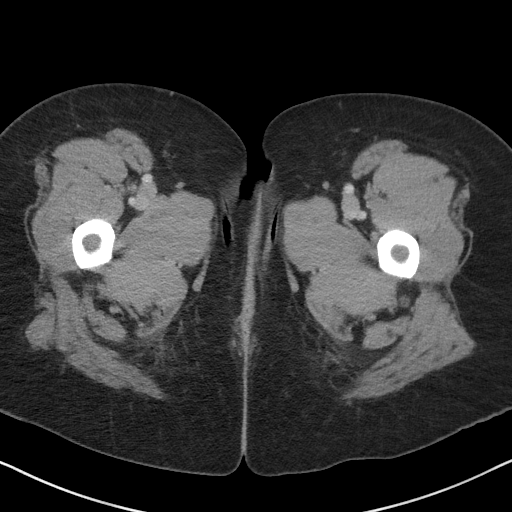
[im 4/94  bone]
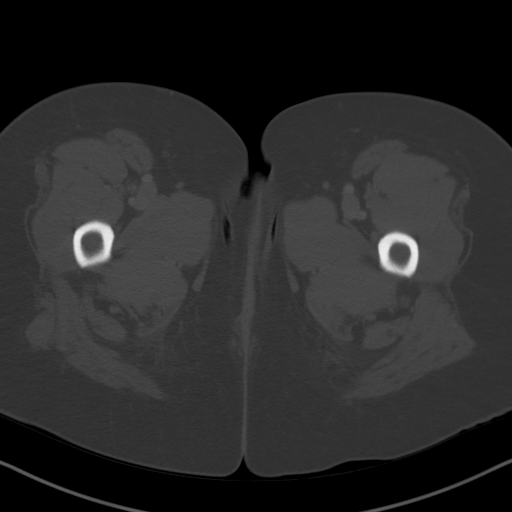
[im 12/94  soft-tissue]
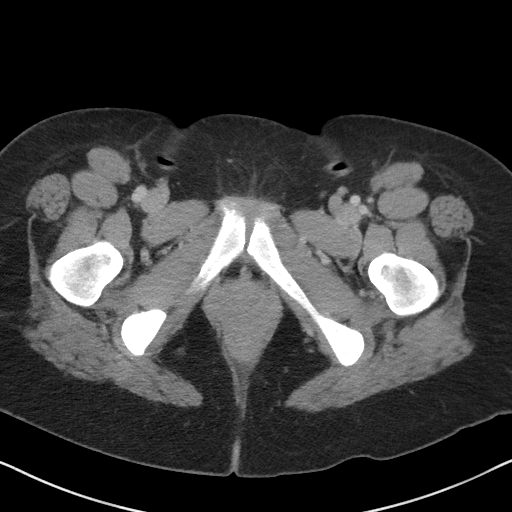
[im 20/94  soft-tissue]
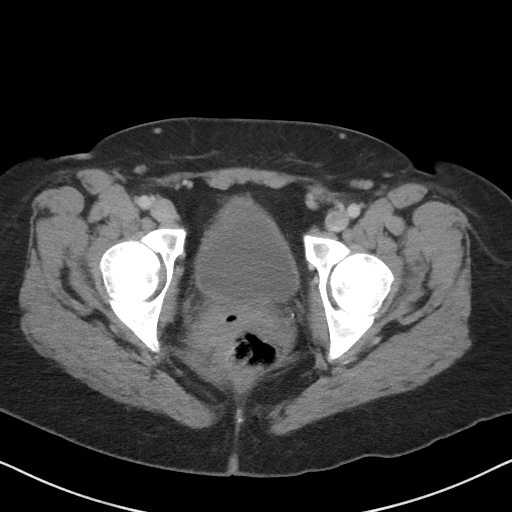
[im 24/94  soft-tissue]
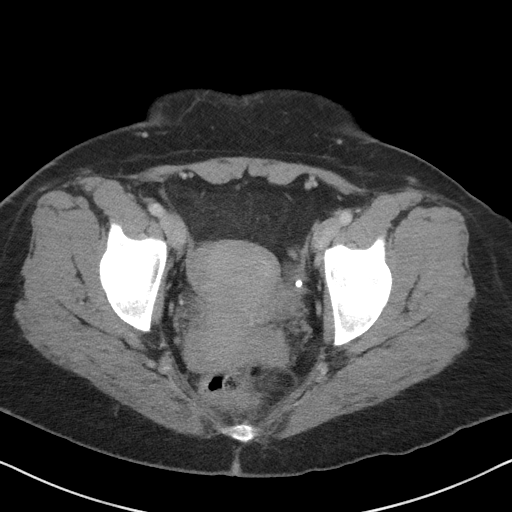
[im 32/94  soft-tissue]
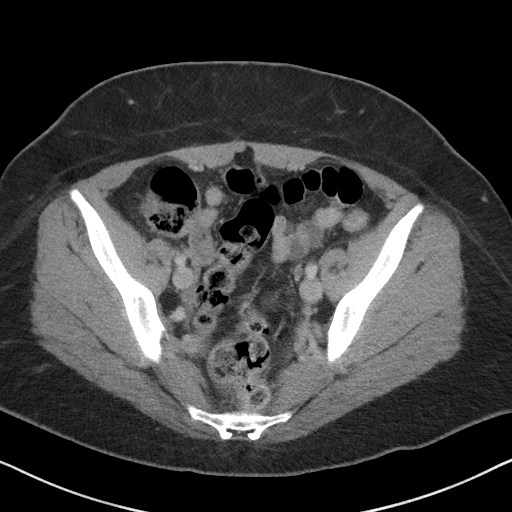
[im 39/94  soft-tissue]
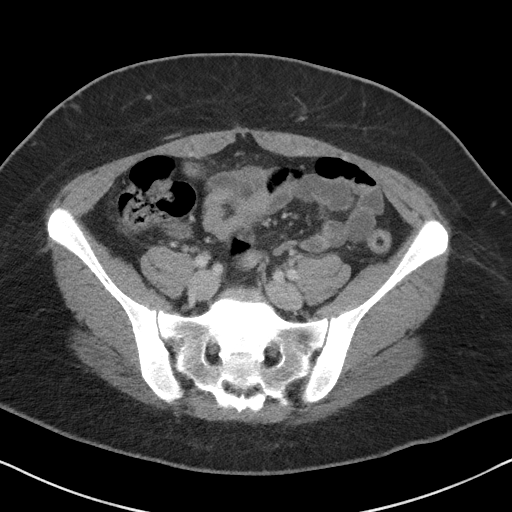
[im 43/94  soft-tissue]
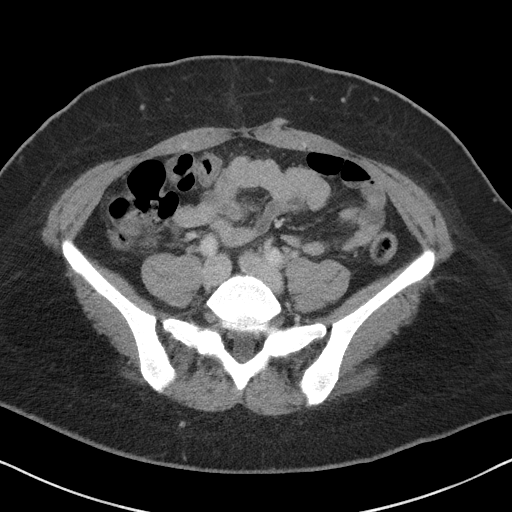
[im 51/94  soft-tissue]
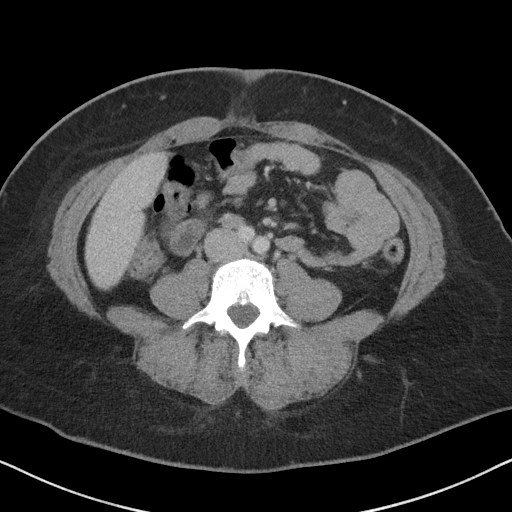
[im 55/94  soft-tissue]
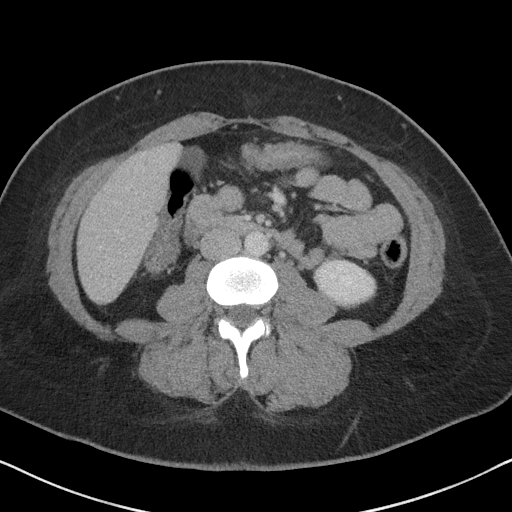
[im 55/94  bone]
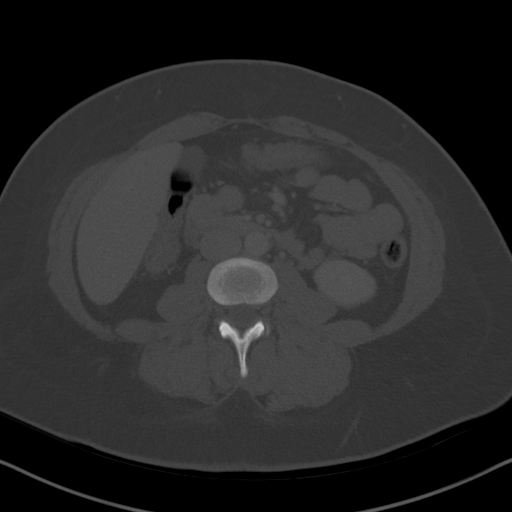
[im 63/94  soft-tissue]
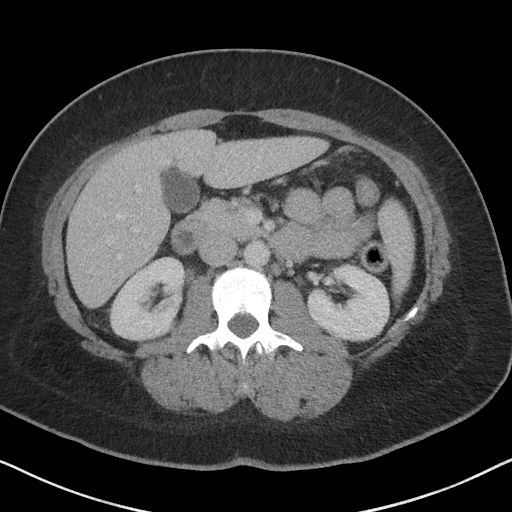
[im 70/94  soft-tissue]
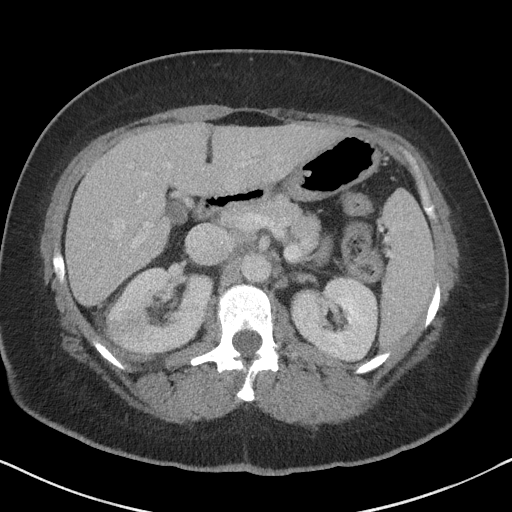
[im 74/94  soft-tissue]
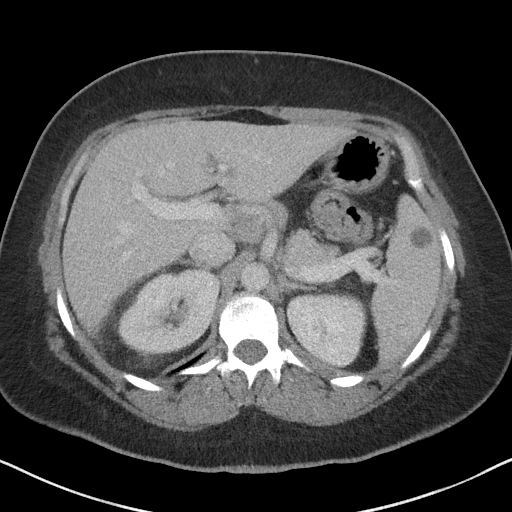
[im 82/94  soft-tissue]
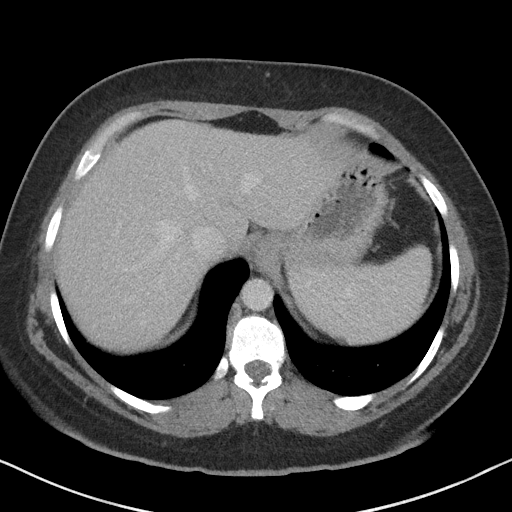
[im 90/94  soft-tissue]
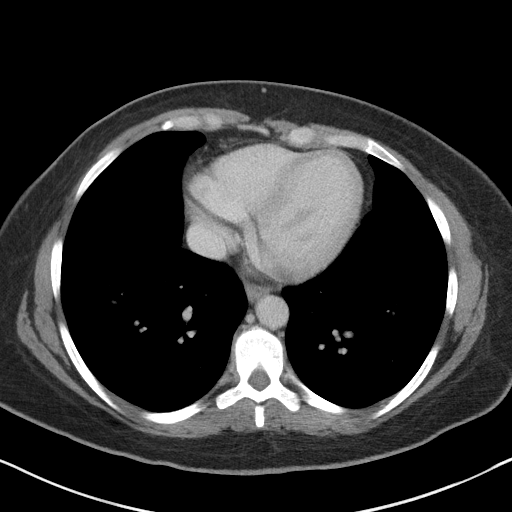

[Series 5: coronal st · coronal · 0.90mm/px · 3 of 99 slices shown]
[im 33/99  soft-tissue]
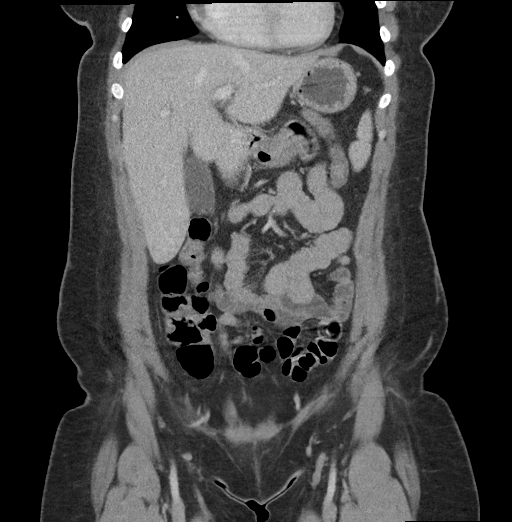
[im 44/99  soft-tissue]
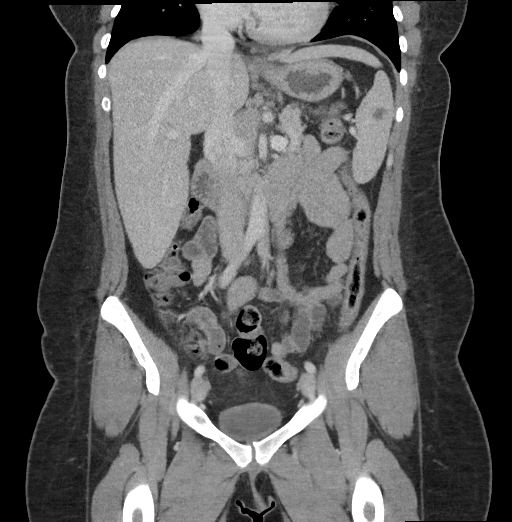
[im 55/99  soft-tissue]
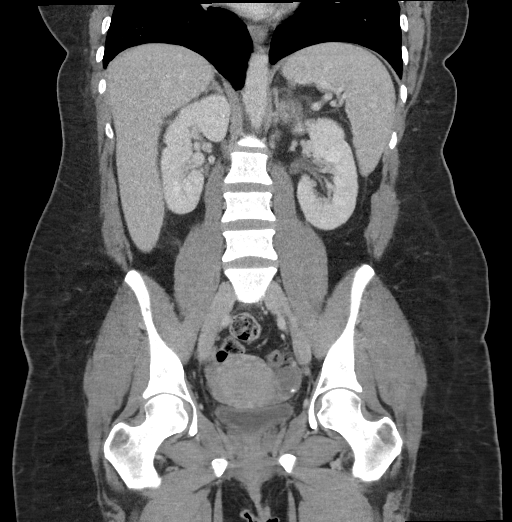

[17 of 46 positions shown; findings below may reference images not displayed]

FINDINGS: Lower Chest: No acute findings.

Hepatobiliary: No hepatic masses identified. Gallbladder is
unremarkable. No evidence of biliary ductal dilatation.

Pancreas:  No mass or inflammatory changes.

Spleen: Within normal limits in size. 1.4 cm splenic cyst
incidentally noted.

Adrenals/Urinary Tract: Focal ill-defined area of decreased
enhancement is seen in the lateral midpole of the right kidney with
mild right perinephric soft tissue stranding. This is consistent
with pyelonephritis. No evidence of abscess. No evidence of ureteral
calculi or hydronephrosis.

Stomach/Bowel: No evidence of obstruction, inflammatory process or
abnormal fluid collections.

Vascular/Lymphatic: No pathologically enlarged lymph nodes. No
abdominal aortic aneurysm.

Reproductive:  No mass or other significant abnormality.

Other:  None.

Musculoskeletal:  No suspicious bone lesions identified.
IMPRESSION: Findings consistent with right sided pyelonephritis. No evidence of
abscess or hydronephrosis.

## 2022-09-16 ENCOUNTER — Encounter: Payer: Self-pay | Admitting: Emergency Medicine

## 2022-09-16 ENCOUNTER — Emergency Department
Admission: EM | Admit: 2022-09-16 | Discharge: 2022-09-16 | Disposition: A | Discharge status: Home / Self Care | Payer: 59 | Attending: Emergency Medicine | Admitting: Emergency Medicine

## 2022-09-16 ENCOUNTER — Other Ambulatory Visit: Payer: Self-pay

## 2022-09-16 DIAGNOSIS — K047 Periapical abscess without sinus: Secondary | ICD-10-CM | POA: Insufficient documentation

## 2022-09-16 DIAGNOSIS — R220 Localized swelling, mass and lump, head: Secondary | ICD-10-CM | POA: Diagnosis not present

## 2022-09-16 DIAGNOSIS — K029 Dental caries, unspecified: Secondary | ICD-10-CM | POA: Diagnosis not present

## 2022-09-16 MED ORDER — CEFTRIAXONE SODIUM 1 G IJ SOLR
1.0000 g | Freq: Once | INTRAMUSCULAR | Status: AC
  Administered 2022-09-16: 1 g via INTRAMUSCULAR
  Filled 2022-09-16: qty 10

## 2022-09-16 MED ORDER — LIDOCAINE HCL (PF) 1 % IJ SOLN
5.0000 mL | Freq: Once | INTRAMUSCULAR | Status: AC
  Administered 2022-09-16: 5 mL
  Filled 2022-09-16: qty 5

## 2022-09-16 MED ORDER — AMOXICILLIN 875 MG PO TABS: 875.0000 mg | ORAL_TABLET | Freq: Two times a day (BID) | ORAL | 0 refills | Status: DC

## 2022-09-16 NOTE — ED Triage Notes (Signed)
Patient c/o left sided facial swelling for 3 days. Patient states today feels like her gum is swollen. Patient denies dyspnea and can handle oral secretions. Patient states she hasn't been able to tolerate soft foods for two days.

## 2022-09-16 NOTE — ED Provider Notes (Signed)
[facility] Provider Note    Event Date/Time   First MD Initiated Contact with Patient 09/16/22 1009     (approximate)   History   Facial Swelling   HPI  Crystal Jacobson is a 37 y.o. female   presents to the ED with complaint of left facial swelling that began yesterday is worse morning.  Patient denies any fever.        Physical Exam   Triage Vital Signs: ED Triage Vitals  Enc Vitals Group     BP 09/16/22 0924 (!) 138/93     Pulse Rate 09/16/22 0924 72     Resp 09/16/22 0924 18     Temp 09/16/22 0924 97.9 F (36.6 C)     Temp Source 09/16/22 0924 Oral     SpO2 09/16/22 0924 100 %     Weight 09/16/22 0926 217 lb (98.4 kg)     Height 09/16/22 0926 5\' 3"  (1.6 m)     Head Circumference --      Peak Flow --      Pain Score 09/16/22 0925 10     Pain Loc --      Pain Edu? --      Excl. in [place]? --     Most recent vital signs: Vitals:   09/16/22 0924  BP: (!) 138/93  Pulse: 72  Resp: 18  Temp: 97.9 F (36.6 C)  SpO2: 100%     General: Awake, no distress.  CV:  Good peripheral perfusion.  Resp:  Normal effort.  Abd:  No distention.  Other:  Patient has multiple caries present both upper and lower posterior molars.  Gums are edematous but no drainage noted.  Area is tender to palpation.  Left facial area is also edematous but not involving the periorbital area.   ED Results / Procedures / Treatments   Labs (all labs ordered are listed, but only abnormal results are displayed) Labs Reviewed - No data to display    PROCEDURES:  Critical Care performed:   Procedures   MEDICATIONS ORDERED IN ED: Medications  cefTRIAXone (ROCEPHIN) injection 1 g (1 g Intramuscular Given 09/16/22 1117)  lidocaine (PF) (XYLOCAINE) 1 % injection 5 mL (5 mLs Infiltration Given 09/16/22 1117)     IMPRESSION / MDM / ASSESSMENT AND PLAN / ED COURSE  I reviewed the triage vital signs and the nursing notes.   Differential diagnosis includes, but is  not limited to, dental abscess, gingivitis, cellulitis, facial edema.  37 year old female presents to the ED with complaint of facial edema that started yesterday and has gotten worse today.  Patient has multiple caries and has not been able to see a dentist.  This also involves edema to the gums and the same area.  Patient was given a list of dental clinics in the area.  Rocephin 1 g IV and was given while in the ED and a prescription for amoxicillin 875 twice daily for 10 days was sent to the pharmacy.  Patient is to continue Tylenol and ibuprofen.  She has to follow-up with a dentist and encouraged to start calling tomorrow to get an appointment.      Patient's presentation is most consistent with acute, uncomplicated illness.  FINAL CLINICAL IMPRESSION(S) / ED DIAGNOSES   Final diagnoses:  Dental abscess     Rx / DC Orders   ED Discharge Orders          Ordered    amoxicillin (AMOXIL) 875 MG tablet  2 times daily        09/16/22 1102             Note:  This document was prepared using Dragon voice recognition software and may include unintentional dictation errors.   Johnn Hai, PA-C 09/16/22 1332    Duffy Bruce, MD 09/17/22 2351

## 2022-09-16 NOTE — Discharge Instructions (Signed)
Begin taking the antibiotic as directed twice a day for the next 10 days.  You may also take Tylenol or ibuprofen as needed for pain.  A list of dental clinics is listed on your discharge papers.  Begin calling tomorrow to see if you can get an appointment for approximately 10 to 12 days from now.  Most likely you will need to see an oral surgeon  [place]  [place] [facility] and [email or web address] [phone])  [facility] [phone])  [place] [phone])  [place] [phone])  [facility] [phone]) This clinic caters to the indigent population and is on a lottery system. Location: [employer] [facility], [place], [place], [place] Clinic Hours: Wednesdays from 6pm - 9pm, patients seen by a lottery system. For dates, call or go to [email or web address] Services: Cleanings, fillings and simple extractions. Payment Options: DENTAL WORK IS FREE OF CHARGE. Bring proof of income or support. Best way to get seen: Arrive at 5:15 pm - this is a lottery, NOT first come/first serve, so arriving earlier will not increase your chances of being seen.     [facility] [phone] Select option 1 for emergencies   Location: [facility], [place], [address], [place] Clinic Hours: No walk-ins accepted - call the day before to schedule an appointment. Check in times are 9:30 am and 1:30 pm. Services: Simple extractions, temporary fillings, pulpectomy/pulp debridement, uncomplicated abscess drainage. Payment Options: PAYMENT IS DUE AT THE TIME OF SERVICE.  Fee is usually $100-200, additional surgical procedures (e.g. abscess drainage) may be extra. Cash, checks,  Visa/MasterCard accepted.  Can file Medicaid if patient is covered for dental - patient should call case worker to check. No discount for [facility] patients. Best way to get seen: MUST call the day before and get onto the schedule. Can usually be seen the next 1-2 days. No walk-ins accepted.     [facility] [phone]   Location: [place], [place] Clinic Hours: M, W, Th, F 8am or 1:30pm, Tues 9a or 1:30 - first come/first served. Services: Simple extractions, temporary fillings, uncomplicated abscess drainage.  You do not need to be an [facility] resident. Payment Options: PAYMENT IS DUE AT THE TIME OF SERVICE. Dental insurance, otherwise sliding scale - bring proof of income or support. Depending on income and treatment needed, cost is usually $50-200. Best way to get seen: Arrive early as it is first come/first served.     [facility] [phone]   Location: [place] [facility] Hours: Mon-Thu 8a-5p Services: Most basic dental services including extractions and fillings. Payment Options: PAYMENT IS DUE AT THE TIME OF SERVICE. Sliding scale, up to 50% off - bring proof if income or support. Medicaid with dental option accepted. Best way to get seen: Call to schedule an appointment, can usually be seen within 2 weeks OR they will try to see walk-ins - show up at [place] or 2p (you may have to wait).     [facility] [place] RESIDENTS ONLY   Location: [facility], [place] [address] [place], [place] [address] Clinic Hours: By appointment only. Monday - Thursday 8am-5pm, Friday 8am-12pm Services: Cleanings, fillings, extractions. Payment Options: PAYMENT IS DUE AT THE TIME  OF SERVICE. Cash, Visa or MasterCard. Sliding scale - $30 minimum per service. Best way to get seen: Come in to office, complete packet and  make an appointment - need proof of income or support monies for each household member and proof of [facility] residence. Usually takes about a month to get in.     [facility] [phone]   Location: [address] [place] Clinic Hours: Walk-in Urgent Care Dental Services are offered Monday-Friday mornings only. The numbers of emergencies accepted daily is limited to the number of providers available. Maximum 15 - Mondays, Wednesdays & Thursdays Maximum 10 - Tuesdays & Fridays Services: You do not need to be a [facility] resident to be seen for a dental emergency. Emergencies are defined as pain, swelling, abnormal bleeding, or dental trauma. Walkins will receive x-rays if needed. NOTE: Dental cleaning is not an emergency. Payment Options: PAYMENT IS DUE AT THE TIME OF SERVICE. Minimum co-pay is $40.00 for uninsured patients. Minimum co-pay is $3.00 for Medicaid with dental coverage. Dental Insurance is accepted and must be presented at time of visit. Medicare does not cover dental. Forms of payment: Cash, credit card, checks. Best way to get seen: If not previously registered with the clinic, walk-in dental registration begins at 7:15 am and is on a first come/first serve basis. If previously registered with the clinic, call to make an appointment.     [facility] [place] ONLY   Location: [address] [place], [place] Clinic Hours: Mon-Thu 10a-2p Services: Extractions only! Payment Options: FREE (donations accepted) - bring proof of income or support Best way to get seen: Call and schedule an appointment OR come at 8am on the 1st Monday of every month (except for holidays) when it is first come/first served.     [facility] [phone]   Location: [place], [place] Clinic Hours: Friday mornings Services, Payment Options, Best way to get seen: Call for info

## 2022-11-24 ENCOUNTER — Other Ambulatory Visit: Payer: Self-pay

## 2022-11-24 ENCOUNTER — Emergency Department: Payer: 59

## 2022-11-24 ENCOUNTER — Emergency Department
Admission: EM | Admit: 2022-11-24 | Discharge: 2022-11-24 | Disposition: A | Discharge status: Home / Self Care | Payer: 59 | Attending: Emergency Medicine | Admitting: Emergency Medicine

## 2022-11-24 DIAGNOSIS — R109 Unspecified abdominal pain: Secondary | ICD-10-CM

## 2022-11-24 DIAGNOSIS — R110 Nausea: Secondary | ICD-10-CM | POA: Insufficient documentation

## 2022-11-24 DIAGNOSIS — N390 Urinary tract infection, site not specified: Secondary | ICD-10-CM | POA: Diagnosis not present

## 2022-11-24 LAB — URINALYSIS, ROUTINE W REFLEX MICROSCOPIC
Bilirubin Urine: NEGATIVE
Glucose, UA: NEGATIVE mg/dL
Hgb urine dipstick: NEGATIVE
Ketones, ur: NEGATIVE mg/dL
Nitrite: POSITIVE — AB
Protein, ur: NEGATIVE mg/dL
Specific Gravity, Urine: 1.011 (ref 1.005–1.030)
pH: 6 (ref 5.0–8.0)

## 2022-11-24 LAB — POC URINE PREG, ED: Preg Test, Ur: NEGATIVE

## 2022-11-24 MED ORDER — HYDROCODONE-ACETAMINOPHEN 5-325 MG PO TABS: 1.0000 | ORAL_TABLET | Freq: Three times a day (TID) | ORAL | 0 refills | Status: AC | PRN

## 2022-11-24 MED ORDER — ONDANSETRON 4 MG PO TBDP
4.0000 mg | ORAL_TABLET | Freq: Once | ORAL | Status: AC
  Administered 2022-11-24: 4 mg via ORAL
  Filled 2022-11-24: qty 1

## 2022-11-24 MED ORDER — CEPHALEXIN 500 MG PO CAPS
500.0000 mg | ORAL_CAPSULE | Freq: Once | ORAL | Status: AC
Start: 2022-11-24 — End: 2022-11-24
  Administered 2022-11-24: 500 mg via ORAL
  Filled 2022-11-24: qty 1

## 2022-11-24 MED ORDER — HYDROCODONE-ACETAMINOPHEN 5-325 MG PO TABS
1.0000 | ORAL_TABLET | Freq: Once | ORAL | Status: AC
  Administered 2022-11-24: 1 via ORAL
  Filled 2022-11-24: qty 1

## 2022-11-24 MED ORDER — CEPHALEXIN 500 MG PO CAPS: 500.0000 mg | ORAL_CAPSULE | Freq: Three times a day (TID) | ORAL | 0 refills | Status: AC

## 2022-11-24 NOTE — ED Provider Notes (Signed)
[facility] Emergency Department Provider Note     Event Date/Time   First MD Initiated Contact with Patient 11/24/22 2020     (approximate)   History   Flank Pain (LEFT)   HPI  Crystal Jacobson is a 37 y.o. female presents to the ED for evaluation of sudden onset of left-sided flank pain while at work.  Patient reports some associated nausea but denies any vomiting, diarrhea, or chest pain.  Also denies any urinary frequency, urgency, or hematuria.  She would report that her urine has been malodorous today.  Denies history of kidney stones and denies recurrent UTIs.   Physical Exam   Triage Vital Signs: ED Triage Vitals  Enc Vitals Group     BP 11/24/22 1922 129/89     Pulse Rate 11/24/22 1922 92     Resp 11/24/22 1922 18     Temp 11/24/22 1922 97.9 F (36.6 C)     Temp Source 11/24/22 1922 Oral     SpO2 11/24/22 1922 98 %     Weight --      Height 11/24/22 1923 '5\' 3"'$  (1.6 m)     Head Circumference --      Peak Flow --      Pain Score 11/24/22 1922 10     Pain Loc --      Pain Edu? --      Excl. in [place]? --     Most recent vital signs: Vitals:   11/24/22 1922 11/24/22 2153  BP: 129/89 (!) 125/91  Pulse: 92 66  Resp: 18 17  Temp: 97.9 F (36.6 C)   SpO2: 98% 100%    General Awake, no distress. NAD CV:  Good peripheral perfusion.  RESP:  Normal effort.  ABD:  No distention. Left flank tenderness on exam.    ED Results / Procedures / Treatments   Labs (all labs ordered are listed, but only abnormal results are displayed) Labs Reviewed  URINALYSIS, ROUTINE W REFLEX MICROSCOPIC - Abnormal; Notable for the following components:      Result Value   Color, Urine YELLOW (*)    APPearance HAZY (*)    Nitrite POSITIVE (*)    Leukocytes,Ua TRACE (*)    Bacteria, UA FEW (*)    All other components within normal limits  POC URINE PREG, ED     EKG   RADIOLOGY  I personally viewed and evaluated these images as part of my  medical decision making, as well as reviewing the written report by the radiologist.  ED Provider Interpretation: No acute findings  CT Renal Stone Study  Result Date: 11/24/2022 CLINICAL DATA:  Left flank pain EXAM: CT ABDOMEN AND PELVIS WITHOUT CONTRAST TECHNIQUE: Multidetector CT imaging of the abdomen and pelvis was performed following the standard protocol without IV contrast. RADIATION DOSE REDUCTION: This exam was performed according to the departmental dose-optimization program which includes automated exposure control, adjustment of the mA and/or kV according to patient size and/or use of iterative reconstruction technique. COMPARISON:  None Available. FINDINGS: Lower chest: No acute abnormality. Hepatobiliary: No focal liver abnormality is seen. No gallstones, gallbladder wall thickening, or biliary dilatation. Pancreas: Unremarkable. No pancreatic ductal dilatation or surrounding inflammatory changes. Spleen: Normal in size. There is a rounded hypodense area in the spleen measuring 17 mm, likely a cyst. Adrenals/Urinary Tract: Adrenal glands are unremarkable. Kidneys are normal, without renal calculi, focal lesion, or hydronephrosis. Bladder is unremarkable. Stomach/Bowel: Stomach is within normal limits. Appendix appears  normal. No evidence of bowel wall thickening, distention, or inflammatory changes. Vascular/Lymphatic: No significant vascular findings are present. No enlarged abdominal or pelvic lymph nodes. Reproductive: Uterus and bilateral adnexa are unremarkable. Other: There is a small fat containing umbilical hernia. There is no free fluid. Musculoskeletal: No acute or significant osseous findings. IMPRESSION: 1. No acute localizing process in the abdomen or pelvis. Electronically Signed   By: Ronney Asters M.D.   On: 11/24/2022 21:19     PROCEDURES:  Critical Care performed: No  Procedures   MEDICATIONS ORDERED IN ED: Medications  HYDROcodone-acetaminophen (NORCO/VICODIN)  5-325 MG per tablet 1 tablet (1 tablet Oral Given 11/24/22 2130)  ondansetron (ZOFRAN-ODT) disintegrating tablet 4 mg (4 mg Oral Given 11/24/22 2130)  cephALEXin (KEFLEX) capsule 500 mg (500 mg Oral Given 11/24/22 2153)     IMPRESSION / MDM / ASSESSMENT AND PLAN / ED COURSE  I reviewed the triage vital signs and the nursing notes.                              Differential diagnosis includes, but is not limited to, UTI, complicated UTI, pyelonephritis, nephrolithiasis  Patient's presentation is most consistent with acute complicated illness / injury requiring diagnostic workup.  Patient's diagnosis is consistent with acute complicated UTI.  With reassuring labs overall and no CT evidence of kidney stones, hydronephrosis, or pyelonephritis, based on my interpretation.  Patient will be discharged home with prescriptions for Keflex and hydrocodone. Patient is to follow up with primary provider as needed or otherwise directed. Patient is given ED precautions to return to the ED for any worsening or new symptoms.   FINAL CLINICAL IMPRESSION(S) / ED DIAGNOSES   Final diagnoses:  Flank pain  Complicated UTI (urinary tract infection)     Rx / DC Orders   ED Discharge Orders          Ordered    HYDROcodone-acetaminophen (NORCO) 5-325 MG tablet  3 times daily PRN        11/24/22 2145    cephALEXin (KEFLEX) 500 MG capsule  3 times daily        11/24/22 2145             Note:  This document was prepared using Dragon voice recognition software and may include unintentional dictation errors.    Melvenia Needles, PA-C 11/25/22 0005    Lavonia Drafts, MD 11/26/22 [phone]

## 2022-11-24 NOTE — ED Provider Notes (Incomplete)
[facility] Emergency Department Provider Note     Event Date/Time   First MD Initiated Contact with Patient 11/24/22 2020     (approximate)   History   Flank Pain (LEFT)   HPI  Crystal Jacobson is a 37 y.o. female presents to the ED for evaluation of sudden onset of left-sided flank pain while at work.  Patient reports some associated nausea but denies any vomiting, diarrhea, or chest pain     Physical Exam   Triage Vital Signs: ED Triage Vitals  Enc Vitals Group     BP 11/24/22 1922 129/89     Pulse Rate 11/24/22 1922 92     Resp 11/24/22 1922 18     Temp 11/24/22 1922 97.9 F (36.6 C)     Temp Source 11/24/22 1922 Oral     SpO2 11/24/22 1922 98 %     Weight --      Height 11/24/22 1923 '5\' 3"'$  (1.6 m)     Head Circumference --      Peak Flow --      Pain Score 11/24/22 1922 10     Pain Loc --      Pain Edu? --      Excl. in [place]? --     Most recent vital signs: Vitals:   11/24/22 1922 11/24/22 2153  BP: 129/89 (!) 125/91  Pulse: 92 66  Resp: 18 17  Temp: 97.9 F (36.6 C)   SpO2: 98% 100%    General Awake, no distress. *** {**HEENT NCAT. PERRL. EOMI. No rhinorrhea. Mucous membranes are moist. **} CV:  Good peripheral perfusion. *** RESP:  Normal effort. *** ABD:  No distention. *** {**Other: **}   ED Results / Procedures / Treatments   Labs (all labs ordered are listed, but only abnormal results are displayed) Labs Reviewed  URINALYSIS, ROUTINE W REFLEX MICROSCOPIC - Abnormal; Notable for the following components:      Result Value   Color, Urine YELLOW (*)    APPearance HAZY (*)    Nitrite POSITIVE (*)    Leukocytes,Ua TRACE (*)    Bacteria, UA FEW (*)    All other components within normal limits  POC URINE PREG, ED     EKG   RADIOLOGY  I personally viewed and evaluated these images as part of my medical decision making, as well as reviewing the written report by the radiologist.  ED Provider  Interpretation: No acute findings  CT Renal Stone Study  Result Date: 11/24/2022 CLINICAL DATA:  Left flank pain EXAM: CT ABDOMEN AND PELVIS WITHOUT CONTRAST TECHNIQUE: Multidetector CT imaging of the abdomen and pelvis was performed following the standard protocol without IV contrast. RADIATION DOSE REDUCTION: This exam was performed according to the departmental dose-optimization program which includes automated exposure control, adjustment of the mA and/or kV according to patient size and/or use of iterative reconstruction technique. COMPARISON:  None Available. FINDINGS: Lower chest: No acute abnormality. Hepatobiliary: No focal liver abnormality is seen. No gallstones, gallbladder wall thickening, or biliary dilatation. Pancreas: Unremarkable. No pancreatic ductal dilatation or surrounding inflammatory changes. Spleen: Normal in size. There is a rounded hypodense area in the spleen measuring 17 mm, likely a cyst. Adrenals/Urinary Tract: Adrenal glands are unremarkable. Kidneys are normal, without renal calculi, focal lesion, or hydronephrosis. Bladder is unremarkable. Stomach/Bowel: Stomach is within normal limits. Appendix appears normal. No evidence of bowel wall thickening, distention, or inflammatory changes. Vascular/Lymphatic: No significant vascular findings are present. No enlarged  abdominal or pelvic lymph nodes. Reproductive: Uterus and bilateral adnexa are unremarkable. Other: There is a small fat containing umbilical hernia. There is no free fluid. Musculoskeletal: No acute or significant osseous findings. IMPRESSION: 1. No acute localizing process in the abdomen or pelvis. Electronically Signed   By: Ronney Asters M.D.   On: 11/24/2022 21:19     PROCEDURES:  Critical Care performed: No  Procedures   MEDICATIONS ORDERED IN ED: Medications  HYDROcodone-acetaminophen (NORCO/VICODIN) 5-325 MG per tablet 1 tablet (1 tablet Oral Given 11/24/22 2130)  ondansetron (ZOFRAN-ODT)  disintegrating tablet 4 mg (4 mg Oral Given 11/24/22 2130)  cephALEXin (KEFLEX) capsule 500 mg (500 mg Oral Given 11/24/22 2153)     IMPRESSION / MDM / ASSESSMENT AND PLAN / ED COURSE  I reviewed the triage vital signs and the nursing notes.                              Differential diagnosis includes, but is not limited to, UTI, complicated UTI, pyelonephritis, nephrolithiasis  Patient's presentation is most consistent with acute complicated illness / injury requiring diagnostic workup.  Patient's diagnosis is consistent with ***. Patient will be discharged home with prescriptions for ***. Patient is to follow up with *** as needed or otherwise directed. Patient is given ED precautions to return to the ED for any worsening or new symptoms.     FINAL CLINICAL IMPRESSION(S) / ED DIAGNOSES   Final diagnoses:  Flank pain  Complicated UTI (urinary tract infection)     Rx / DC Orders   ED Discharge Orders          Ordered    HYDROcodone-acetaminophen (NORCO) 5-325 MG tablet  3 times daily PRN        11/24/22 2145    cephALEXin (KEFLEX) 500 MG capsule  3 times daily        11/24/22 2145             Note:  This document was prepared using Dragon voice recognition software and may include unintentional dictation errors.

## 2022-11-24 NOTE — ED Triage Notes (Signed)
Pt to ED from work for left sided flank pain that was sudden onset while she was at work. Pt denies and V/D or CP. Pt denies urinary symptoms at this time. But states "my pee has been strong".

## 2022-11-24 NOTE — Discharge Instructions (Signed)
Your CT scan is normal without signs of kidney stones or pyelonephritis.  Take the antibiotics as directed.  Continue to rest and hydrate and if you bladder on schedule.  Follow-up with your primary provider or return to the ED if needed.

## 2023-02-25 NOTE — Progress Notes (Signed)
   There were no vitals taken for this visit.   Subjective:    Patient ID: Annelies Coyt, female    DOB: 12-25-1985, 37 y.o.   MRN: [id number]  HPI: Latresa Gasser is a 37 y.o. female  No chief complaint on file.  Establish care: her last physical was ***.  Medical history includes ***.  Family history includes ***.  Health maintenance ***.   Relevant past medical, surgical, family and social history reviewed and updated as indicated. Interim medical history since our last visit reviewed. Allergies and medications reviewed and updated.  Review of Systems  Constitutional: Negative for fever or weight change.  Respiratory: Negative for cough and shortness of breath.   Cardiovascular: Negative for chest pain or palpitations.  Gastrointestinal: Negative for abdominal pain, no bowel changes.  Musculoskeletal: Negative for gait problem or joint swelling.  Skin: Negative for rash.  Neurological: Negative for dizziness or headache.  No other specific complaints in a complete review of systems (except as listed in HPI above).      Objective:    There were no vitals taken for this visit.  Wt Readings from Last 3 Encounters:  09/16/22 217 lb (98.4 kg)  08/30/21 229 lb 4.5 oz (104 kg)  12/08/20 229 lb 4.5 oz (104 kg)    Physical Exam  Constitutional: Patient appears well-developed and well-nourished. Obese *** No distress.  HEENT: head atraumatic, normocephalic, pupils equal and reactive to light, ears ***, neck supple, throat within normal limits Cardiovascular: Normal rate, regular rhythm and normal heart sounds.  No murmur heard. No BLE edema. Pulmonary/Chest: Effort normal and breath sounds normal. No respiratory distress. Abdominal: Soft.  There is no tenderness. Psychiatric: Patient has a normal mood and affect. behavior is normal. Judgment and thought content normal.  Results for orders placed or performed during the hospital encounter of 11/24/22  Urinalysis, Routine w  reflex microscopic -Urine, Clean Catch  Result Value Ref Range   Color, Urine YELLOW (A) YELLOW   APPearance HAZY (A) CLEAR   Specific Gravity, Urine 1.011 1.005 - 1.030   pH 6.0 5.0 - 8.0   Glucose, UA NEGATIVE NEGATIVE mg/dL   Hgb urine dipstick NEGATIVE NEGATIVE   Bilirubin Urine NEGATIVE NEGATIVE   Ketones, ur NEGATIVE NEGATIVE mg/dL   Protein, ur NEGATIVE NEGATIVE mg/dL   Nitrite POSITIVE (A) NEGATIVE   Leukocytes,Ua TRACE (A) NEGATIVE   RBC / HPF 0-5 0 - 5 RBC/hpf   WBC, UA 0-5 0 - 5 WBC/hpf   Bacteria, UA FEW (A) NONE SEEN   Squamous Epithelial / HPF 0-5 0 - 5 /HPF   Mucus PRESENT   POC urine preg, ED  Result Value Ref Range   Preg Test, Ur NEGATIVE NEGATIVE      Assessment & Plan:   Problem List Items Addressed This Visit   None    Follow up plan: No follow-ups on file.

## 2023-02-26 ENCOUNTER — Other Ambulatory Visit: Payer: Self-pay

## 2023-02-26 ENCOUNTER — Ambulatory Visit (INDEPENDENT_AMBULATORY_CARE_PROVIDER_SITE_OTHER): Payer: 59 | Admitting: Nurse Practitioner

## 2023-02-26 ENCOUNTER — Encounter: Payer: Self-pay | Admitting: Nurse Practitioner

## 2023-02-26 VITALS — BP 124/82 | HR 96 | Temp 97.7°F | Resp 18 | Ht 64.0 in | Wt 232.1 lb

## 2023-02-26 DIAGNOSIS — F419 Anxiety disorder, unspecified: Secondary | ICD-10-CM | POA: Diagnosis not present

## 2023-02-26 DIAGNOSIS — Z6839 Body mass index (BMI) 39.0-39.9, adult: Secondary | ICD-10-CM

## 2023-02-26 DIAGNOSIS — Z114 Encounter for screening for human immunodeficiency virus [HIV]: Secondary | ICD-10-CM | POA: Diagnosis not present

## 2023-02-26 DIAGNOSIS — Z13220 Encounter for screening for lipoid disorders: Secondary | ICD-10-CM

## 2023-02-26 DIAGNOSIS — R7303 Prediabetes: Secondary | ICD-10-CM | POA: Diagnosis not present

## 2023-02-26 DIAGNOSIS — F331 Major depressive disorder, recurrent, moderate: Secondary | ICD-10-CM | POA: Diagnosis not present

## 2023-02-26 DIAGNOSIS — Z131 Encounter for screening for diabetes mellitus: Secondary | ICD-10-CM

## 2023-02-26 DIAGNOSIS — Z1159 Encounter for screening for other viral diseases: Secondary | ICD-10-CM | POA: Diagnosis not present

## 2023-02-26 DIAGNOSIS — R739 Hyperglycemia, unspecified: Secondary | ICD-10-CM | POA: Diagnosis not present

## 2023-02-26 DIAGNOSIS — Z130 Encounter for screening for diseases of the blood and blood-forming organs and certain disorders involving the immune mechanism: Secondary | ICD-10-CM | POA: Diagnosis not present

## 2023-02-26 DIAGNOSIS — F5105 Insomnia due to other mental disorder: Secondary | ICD-10-CM | POA: Diagnosis not present

## 2023-02-26 DIAGNOSIS — F99 Mental disorder, not otherwise specified: Secondary | ICD-10-CM | POA: Insufficient documentation

## 2023-02-26 LAB — POCT GLYCOSYLATED HEMOGLOBIN (HGB A1C): Hemoglobin A1C: 5.7 % — AB (ref 4.0–5.6)

## 2023-02-26 MED ORDER — HYDROXYZINE PAMOATE 25 MG PO CAPS: 25.0000 mg | ORAL_CAPSULE | Freq: Every evening | ORAL | 0 refills | Status: DC | PRN

## 2023-02-26 MED ORDER — METFORMIN HCL 500 MG PO TABS
500.0000 mg | ORAL_TABLET | Freq: Two times a day (BID) | ORAL | 3 refills | Status: DC
Start: 2023-02-26 — End: 2023-10-31

## 2023-02-26 MED ORDER — FLUOXETINE HCL 20 MG PO TABS: 20.0000 mg | ORAL_TABLET | Freq: Every day | ORAL | 0 refills | Status: DC

## 2023-02-26 NOTE — Assessment & Plan Note (Signed)
Start prozac 20 mg daily, hydroxyzine 25 mg at bedtime. follow up in 4 weeks

## 2023-02-26 NOTE — Assessment & Plan Note (Signed)
Start metformin 500 mg two times a day.  Try and work on lifestyle modification.

## 2023-03-01 LAB — CBC WITH DIFFERENTIAL/PLATELET
Absolute Monocytes: 420 cells/uL (ref 200–950)
Basophils Absolute: 30 cells/uL (ref 0–200)
Basophils Relative: 0.5 %
Eosinophils Absolute: 180 cells/uL (ref 15–500)
Eosinophils Relative: 3 %
HCT: 39 % (ref 35.0–45.0)
Hemoglobin: 13 g/dL (ref 11.7–15.5)
Lymphs Abs: 2178 cells/uL (ref 850–3900)
MCH: 29.8 pg (ref 27.0–33.0)
MCHC: 33.3 g/dL (ref 32.0–36.0)
MCV: 89.4 fL (ref 80.0–100.0)
MPV: 10.6 fL (ref 7.5–12.5)
Monocytes Relative: 7 %
Neutro Abs: 3192 cells/uL (ref 1500–7800)
Neutrophils Relative %: 53.2 %
Platelets: 312 10*3/uL (ref 140–400)
RBC: 4.36 10*6/uL (ref 3.80–5.10)
RDW: 12.3 % (ref 11.0–15.0)
Total Lymphocyte: 36.3 %
WBC: 6 10*3/uL (ref 3.8–10.8)

## 2023-03-01 LAB — LIPID PANEL
Cholesterol: 157 mg/dL (ref <200)
HDL: 55 mg/dL (ref >=50)
LDL Cholesterol (Calc): 87 mg/dL (calc)
Non-HDL Cholesterol (Calc): 102 mg/dL (calc) (ref <130)
Total CHOL/HDL Ratio: 2.9 (calc) (ref <5.0)
Triglycerides: 63 mg/dL (ref <150)

## 2023-03-01 LAB — COMPLETE METABOLIC PANEL WITH GFR
AG Ratio: 1.2 (calc) (ref 1.0–2.5)
ALT: 34 U/L — ABNORMAL HIGH (ref 6–29)
AST: 38 U/L — ABNORMAL HIGH (ref 10–30)
Albumin: 3.9 g/dL (ref 3.6–5.1)
Alkaline phosphatase (APISO): 79 U/L (ref 31–125)
BUN: 13 mg/dL (ref 7–25)
CO2: 26 mmol/L (ref 20–32)
Calcium: 9.1 mg/dL (ref 8.6–10.2)
Chloride: 105 mmol/L (ref 98–110)
Creat: 0.62 mg/dL (ref 0.50–0.97)
Globulin: 3.3 g/dL (calc) (ref 1.9–3.7)
Glucose, Bld: 83 mg/dL (ref 65–99)
Potassium: 4.8 mmol/L (ref 3.5–5.3)
Sodium: 138 mmol/L (ref 135–146)
Total Bilirubin: 0.2 mg/dL (ref 0.2–1.2)
Total Protein: 7.2 g/dL (ref 6.1–8.1)
eGFR: 119 mL/min/{1.73_m2} (ref >=60)

## 2023-03-01 LAB — HIV ANTIBODY (ROUTINE TESTING W REFLEX): HIV 1&2 Ab, 4th Generation: NONREACTIVE

## 2023-03-01 LAB — HEPATITIS C RNA QUANTITATIVE
HCV Quantitative Log: 6.24 Log IU/mL — ABNORMAL HIGH
HCV RNA, PCR, QN: 1740000 IU/mL — ABNORMAL HIGH

## 2023-03-01 LAB — HEPATITIS C ANTIBODY: Hepatitis C Ab: REACTIVE — AB

## 2023-03-02 ENCOUNTER — Other Ambulatory Visit: Payer: Self-pay | Admitting: Nurse Practitioner

## 2023-03-02 DIAGNOSIS — R768 Other specified abnormal immunological findings in serum: Secondary | ICD-10-CM

## 2023-03-25 NOTE — Progress Notes (Unsigned)
LMP 01/16/2023    Subjective:    Patient ID: Crystal Jacobson, female    DOB: May 31, 1986, 37 y.o.   MRN: [id number]  HPI: Crystal Jacobson is a 38 y.o. female  No chief complaint on file.   Depression/anxiety/insomnia: she has struggled with anxiety and depression for many years.  She says she has been having trouble sleeping as well..  Medication she previously tried was zoloft which made her cry, she also tried prozac she says that helped restarted prozac last visit, she is here today for 4 week follow up.   Compliant *** Side effects *** PHQ9 *** GAD *** Therapy no currently     02/26/2023   10:06 AM  Depression screen PHQ 2/9  Decreased Interest 3  Down, Depressed, Hopeless 3  PHQ - 2 Score 6  Altered sleeping 3  Tired, decreased energy 3  Change in appetite 3  Feeling bad or failure about yourself  2  Trouble concentrating 1  Moving slowly or fidgety/restless 0  Suicidal thoughts 0  PHQ-9 Score 18  Difficult doing work/chores Somewhat difficult       02/26/2023   10:07 AM  GAD 7 : Generalized Anxiety Score  Nervous, Anxious, on Edge 1  Control/stop worrying 1  Worry too much - different things 3  Trouble relaxing 3  Restless 3  Easily annoyed or irritable 3  Afraid - awful might happen 3  Total GAD 7 Score 17  Anxiety Difficulty Somewhat difficult     Relevant past medical, surgical, family and social history reviewed and updated as indicated. Interim medical history since our last visit reviewed. Allergies and medications reviewed and updated.  Review of Systems  Constitutional: Negative for fever or weight change.  Respiratory: Negative for cough and shortness of breath.   Cardiovascular: Negative for chest pain or palpitations.  Gastrointestinal: Negative for abdominal pain, no bowel changes.  Musculoskeletal: Negative for gait problem or joint swelling.  Skin: Negative for rash.  Neurological: Negative for dizziness or headache.  No other  specific complaints in a complete review of systems (except as listed in HPI above).      Objective:    LMP 01/16/2023   Wt Readings from Last 3 Encounters:  02/26/23 232 lb 1.6 oz (105.3 kg)  09/16/22 217 lb (98.4 kg)  08/30/21 229 lb 4.5 oz (104 kg)    Physical Exam  Constitutional: Patient appears well-developed and well-nourished. Obese  No distress.  HEENT: head atraumatic, normocephalic, pupils equal and reactive to light, neck supple Cardiovascular: Normal rate, regular rhythm and normal heart sounds.  No murmur heard. No BLE edema. Pulmonary/Chest: Effort normal and breath sounds normal. No respiratory distress. Abdominal: Soft.  There is no tenderness. Psychiatric: Patient has a normal mood and affect. behavior is normal. Judgment and thought content normal.  Results for orders placed or performed in visit on 02/26/23  CBC with Differential/Platelet  Result Value Ref Range   WBC 6.0 3.8 - 10.8 Thousand/uL   RBC 4.36 3.80 - 5.10 Million/uL   Hemoglobin 13.0 11.7 - 15.5 g/dL   HCT 40.9 81.1 - 91.4 %   MCV 89.4 80.0 - 100.0 fL   MCH 29.8 27.0 - 33.0 pg   MCHC 33.3 32.0 - 36.0 g/dL   RDW 78.2 95.6 - 21.3 %   Platelets 312 140 - 400 Thousand/uL   MPV 10.6 7.5 - 12.5 fL   Neutro Abs 3,192 1,500 - 7,800 cells/uL   Lymphs Abs 2,178 850 - 3,900 cells/uL  Absolute Monocytes 420 200 - 950 cells/uL   Eosinophils Absolute 180 15 - 500 cells/uL   Basophils Absolute 30 0 - 200 cells/uL   Neutrophils Relative % 53.2 %   Total Lymphocyte 36.3 %   Monocytes Relative 7.0 %   Eosinophils Relative 3.0 %   Basophils Relative 0.5 %  COMPLETE METABOLIC PANEL WITH GFR  Result Value Ref Range   Glucose, Bld 83 65 - 99 mg/dL   BUN 13 7 - 25 mg/dL   Creat 1.61 0.96 - 0.45 mg/dL   eGFR 409 > OR = 60 WJ/XBJ/4.78G9   BUN/Creatinine Ratio SEE NOTE: 6 - 22 (calc)   Sodium 138 135 - 146 mmol/L   Potassium 4.8 3.5 - 5.3 mmol/L   Chloride 105 98 - 110 mmol/L   CO2 26 20 - 32 mmol/L    Calcium 9.1 8.6 - 10.2 mg/dL   Total Protein 7.2 6.1 - 8.1 g/dL   Albumin 3.9 3.6 - 5.1 g/dL   Globulin 3.3 1.9 - 3.7 g/dL (calc)   AG Ratio 1.2 1.0 - 2.5 (calc)   Total Bilirubin 0.2 0.2 - 1.2 mg/dL   Alkaline phosphatase (APISO) 79 31 - 125 U/L   AST 38 (H) 10 - 30 U/L   ALT 34 (H) 6 - 29 U/L  Lipid panel  Result Value Ref Range   Cholesterol 157 <200 mg/dL   HDL 55 > OR = 50 mg/dL   Triglycerides 63 <562 mg/dL   LDL Cholesterol (Calc) 87 mg/dL (calc)   Total CHOL/HDL Ratio 2.9 <5.0 (calc)   Non-HDL Cholesterol (Calc) 102 <130 mg/dL (calc)  Hepatitis C antibody  Result Value Ref Range   Hepatitis C Ab REACTIVE (A) NON-REACTIVE  HIV Antibody (routine testing w rflx)  Result Value Ref Range   HIV 1&2 Ab, 4th Generation NON-REACTIVE NON-REACTIVE  Hepatitis C RNA quantitative  Result Value Ref Range   HCV RNA, PCR, QN 1,740,000 (H) NOT DETECTED IU/mL   HCV Quantitative Log 6.24 (H) NOT DETECTED Log IU/mL  POCT HgB A1C  Result Value Ref Range   Hemoglobin A1C 5.7 (A) 4.0 - 5.6 %   HbA1c POC (<> result, manual entry)     HbA1c, POC (prediabetic range)     HbA1c, POC (controlled diabetic range)        Assessment & Plan:   Problem List Items Addressed This Visit   None    Follow up plan: No follow-ups on file.

## 2023-03-26 ENCOUNTER — Encounter: Payer: Self-pay | Admitting: Nurse Practitioner

## 2023-03-26 ENCOUNTER — Ambulatory Visit (INDEPENDENT_AMBULATORY_CARE_PROVIDER_SITE_OTHER): Payer: 59 | Admitting: Nurse Practitioner

## 2023-03-26 ENCOUNTER — Other Ambulatory Visit: Payer: Self-pay

## 2023-03-26 VITALS — BP 128/72 | HR 75 | Temp 97.9°F | Resp 16 | Ht 64.0 in | Wt 230.5 lb

## 2023-03-26 DIAGNOSIS — F331 Major depressive disorder, recurrent, moderate: Secondary | ICD-10-CM | POA: Diagnosis not present

## 2023-03-26 DIAGNOSIS — F419 Anxiety disorder, unspecified: Secondary | ICD-10-CM | POA: Diagnosis not present

## 2023-03-26 DIAGNOSIS — R222 Localized swelling, mass and lump, trunk: Secondary | ICD-10-CM | POA: Diagnosis not present

## 2023-03-26 MED ORDER — ESCITALOPRAM OXALATE 10 MG PO TABS: 10.0000 mg | ORAL_TABLET | Freq: Every day | ORAL | 0 refills | Status: DC

## 2023-03-26 NOTE — Assessment & Plan Note (Signed)
Stop prozac, start lexapro, follow up in 4 weeks

## 2023-03-26 NOTE — Assessment & Plan Note (Signed)
Stop prozac, start lexapro, follow up in 4 weeks 

## 2023-04-02 ENCOUNTER — Ambulatory Visit
Admission: RE | Admit: 2023-04-02 | Discharge: 2023-04-02 | Disposition: A | Discharge status: Home / Self Care | Payer: 59 | Source: Ambulatory Visit | Attending: Nurse Practitioner | Admitting: Nurse Practitioner

## 2023-04-02 DIAGNOSIS — R222 Localized swelling, mass and lump, trunk: Secondary | ICD-10-CM | POA: Diagnosis not present

## 2023-04-24 ENCOUNTER — Telehealth: Payer: 59 | Admitting: Nurse Practitioner

## 2023-04-24 NOTE — Progress Notes (Deleted)
LMP 03/18/2023    Subjective:    Patient ID: Crystal Jacobson, female    DOB: 11-07-1985, 37 y.o.   MRN: [id number]  HPI: Crystal Jacobson is a 37 y.o. female  No chief complaint on file.  Depression/anxiety/insomnia Medication lexapro 10 mg daily, hydroxyzine 25 mg at bedtime as needed.  Compliant *** Side effects *** PHQ9 *** GAD *** Medication she previously tried was zoloft which made her cry, she also tried prozac she says that helped restarted prozac She reported initially it was helping but now she is more irritable.       03/26/2023    9:57 AM 02/26/2023   10:06 AM  Depression screen PHQ 2/9  Decreased Interest 2 3  Down, Depressed, Hopeless 3 3  PHQ - 2 Score 5 6  Altered sleeping 1 3  Tired, decreased energy 1 3  Change in appetite 0 3  Feeling bad or failure about yourself  1 2  Trouble concentrating 1 1  Moving slowly or fidgety/restless 0 0  Suicidal thoughts 0 0  PHQ-9 Score 9 18  Difficult doing work/chores Somewhat difficult Somewhat difficult       03/26/2023    9:58 AM 02/26/2023   10:07 AM  GAD 7 : Generalized Anxiety Score  Nervous, Anxious, on Edge 2 1  Control/stop worrying 0 1  Worry too much - different things 3 3  Trouble relaxing 1 3  Restless 1 3  Easily annoyed or irritable 1 3  Afraid - awful might happen 1 3  Total GAD 7 Score 9 17  Anxiety Difficulty Somewhat difficult Somewhat difficult   Soft tissue swelling:  she noticed  6-7 months ago she had some swelling to her chest.  She says the size changes but is always there. She says it does not cause any pain.  No redness.   Obtained ultrasound and it was negative.   Hep C; she is scheduled August 20 at 330 pm with GI to start treatment.    Relevant past medical, surgical, family and social history reviewed and updated as indicated. Interim medical history since our last visit reviewed. Allergies and medications reviewed and updated.  Review of Systems  Constitutional: Negative  for fever or weight change.  Respiratory: Negative for cough and shortness of breath.   Cardiovascular: Negative for chest pain or palpitations.  Gastrointestinal: Negative for abdominal pain, no bowel changes.  Musculoskeletal: Negative for gait problem or joint swelling.  Skin: Negative for rash.  Neurological: Negative for dizziness or headache.  No other specific complaints in a complete review of systems (except as listed in HPI above).      Objective:    LMP 03/18/2023   Wt Readings from Last 3 Encounters:  03/26/23 230 lb 8 oz (104.6 kg)  02/26/23 232 lb 1.6 oz (105.3 kg)  09/16/22 217 lb (98.4 kg)    Physical Exam  Constitutional: Patient appears well-developed and well-nourished. Obese  No distress.  HEENT: head atraumatic, normocephalic, pupils equal and reactive to light, neck supple Cardiovascular: Normal rate, regular rhythm and normal heart sounds.  No murmur heard. No BLE edema. Pulmonary/Chest: Effort normal and breath sounds normal. No respiratory distress. Abdominal: Soft.  There is no tenderness. Psychiatric: Patient has a normal mood and affect. behavior is normal. Judgment and thought content normal.  Results for orders placed or performed in visit on 02/26/23  CBC with Differential/Platelet  Result Value Ref Range   WBC 6.0 3.8 - 10.8 Thousand/uL   RBC  4.36 3.80 - 5.10 Million/uL   Hemoglobin 13.0 11.7 - 15.5 g/dL   HCT 16.1 09.6 - 04.5 %   MCV 89.4 80.0 - 100.0 fL   MCH 29.8 27.0 - 33.0 pg   MCHC 33.3 32.0 - 36.0 g/dL   RDW 40.9 81.1 - 91.4 %   Platelets 312 140 - 400 Thousand/uL   MPV 10.6 7.5 - 12.5 fL   Neutro Abs 3,192 1,500 - 7,800 cells/uL   Lymphs Abs 2,178 850 - 3,900 cells/uL   Absolute Monocytes 420 200 - 950 cells/uL   Eosinophils Absolute 180 15 - 500 cells/uL   Basophils Absolute 30 0 - 200 cells/uL   Neutrophils Relative % 53.2 %   Total Lymphocyte 36.3 %   Monocytes Relative 7.0 %   Eosinophils Relative 3.0 %   Basophils Relative  0.5 %  COMPLETE METABOLIC PANEL WITH GFR  Result Value Ref Range   Glucose, Bld 83 65 - 99 mg/dL   BUN 13 7 - 25 mg/dL   Creat 7.82 9.56 - 2.13 mg/dL   eGFR 086 > OR = 60 VH/QIO/9.62X5   BUN/Creatinine Ratio SEE NOTE: 6 - 22 (calc)   Sodium 138 135 - 146 mmol/L   Potassium 4.8 3.5 - 5.3 mmol/L   Chloride 105 98 - 110 mmol/L   CO2 26 20 - 32 mmol/L   Calcium 9.1 8.6 - 10.2 mg/dL   Total Protein 7.2 6.1 - 8.1 g/dL   Albumin 3.9 3.6 - 5.1 g/dL   Globulin 3.3 1.9 - 3.7 g/dL (calc)   AG Ratio 1.2 1.0 - 2.5 (calc)   Total Bilirubin 0.2 0.2 - 1.2 mg/dL   Alkaline phosphatase (APISO) 79 31 - 125 U/L   AST 38 (H) 10 - 30 U/L   ALT 34 (H) 6 - 29 U/L  Lipid panel  Result Value Ref Range   Cholesterol 157 <200 mg/dL   HDL 55 > OR = 50 mg/dL   Triglycerides 63 <284 mg/dL   LDL Cholesterol (Calc) 87 mg/dL (calc)   Total CHOL/HDL Ratio 2.9 <5.0 (calc)   Non-HDL Cholesterol (Calc) 102 <130 mg/dL (calc)  Hepatitis C antibody  Result Value Ref Range   Hepatitis C Ab REACTIVE (A) NON-REACTIVE  HIV Antibody (routine testing w rflx)  Result Value Ref Range   HIV 1&2 Ab, 4th Generation NON-REACTIVE NON-REACTIVE  Hepatitis C RNA quantitative  Result Value Ref Range   HCV RNA, PCR, QN 1,740,000 (H) NOT DETECTED IU/mL   HCV Quantitative Log 6.24 (H) NOT DETECTED Log IU/mL  POCT HgB A1C  Result Value Ref Range   Hemoglobin A1C 5.7 (A) 4.0 - 5.6 %   HbA1c POC (<> result, manual entry)     HbA1c, POC (prediabetic range)     HbA1c, POC (controlled diabetic range)        Assessment & Plan:   Problem List Items Addressed This Visit   None     Follow up plan: No follow-ups on file.

## 2023-04-30 ENCOUNTER — Ambulatory Visit (INDEPENDENT_AMBULATORY_CARE_PROVIDER_SITE_OTHER): Payer: 59 | Admitting: Nurse Practitioner

## 2023-04-30 ENCOUNTER — Encounter: Payer: Self-pay | Admitting: Nurse Practitioner

## 2023-04-30 ENCOUNTER — Other Ambulatory Visit: Payer: Self-pay

## 2023-04-30 VITALS — BP 124/72 | HR 83 | Temp 97.8°F | Resp 18 | Ht 64.0 in | Wt 238.7 lb

## 2023-04-30 DIAGNOSIS — F99 Mental disorder, not otherwise specified: Secondary | ICD-10-CM | POA: Diagnosis not present

## 2023-04-30 DIAGNOSIS — R222 Localized swelling, mass and lump, trunk: Secondary | ICD-10-CM

## 2023-04-30 DIAGNOSIS — R768 Other specified abnormal immunological findings in serum: Secondary | ICD-10-CM

## 2023-04-30 DIAGNOSIS — F331 Major depressive disorder, recurrent, moderate: Secondary | ICD-10-CM

## 2023-04-30 DIAGNOSIS — F419 Anxiety disorder, unspecified: Secondary | ICD-10-CM | POA: Diagnosis not present

## 2023-04-30 DIAGNOSIS — F5105 Insomnia due to other mental disorder: Secondary | ICD-10-CM | POA: Diagnosis not present

## 2023-04-30 MED ORDER — HYDROXYZINE PAMOATE 25 MG PO CAPS
25.0000 mg | ORAL_CAPSULE | Freq: Every evening | ORAL | 1 refills | Status: DC | PRN
Start: 2023-04-30 — End: 2023-10-31

## 2023-04-30 NOTE — Progress Notes (Signed)
BP 124/72   Pulse 83   Temp 97.8 F (36.6 C) (Oral)   Resp 18   Ht 5\' 4"  (1.626 m)   Wt 238 lb 11.2 oz (108.3 kg)   SpO2 94%   BMI 40.97 kg/m    Subjective:    Patient ID: Crystal Jacobson, female    DOB: 1985/09/23, 37 y.o.   MRN: [id number]  HPI: Crystal Jacobson is a 37 y.o. female  Chief Complaint  Patient presents with   Depression   Anxiety   SOFT STISSUE WELLING    Chest wall follow up   Depression/anxiety/insomnia Medication hydroxyzine 25 mg at bedtime as needed. She is not taking the lexapro, she reports she is doing well. Will continue with current treatment.  Compliant yes Side effects none PHQ9 negative GAD negative      04/30/2023   10:53 AM 03/26/2023    9:57 AM 02/26/2023   10:06 AM  Depression screen PHQ 2/9  Decreased Interest 0 2 3  Down, Depressed, Hopeless 0 3 3  PHQ - 2 Score 0 5 6  Altered sleeping 0 1 3  Tired, decreased energy 0 1 3  Change in appetite 0 0 3  Feeling bad or failure about yourself  0 1 2  Trouble concentrating 0 1 1  Moving slowly or fidgety/restless 0 0 0  Suicidal thoughts 0 0 0  PHQ-9 Score 0 9 18  Difficult doing work/chores Not difficult at all Somewhat difficult Somewhat difficult       04/30/2023   10:53 AM 03/26/2023    9:58 AM 02/26/2023   10:07 AM  GAD 7 : Generalized Anxiety Score  Nervous, Anxious, on Edge 0 2 1  Control/stop worrying 0 0 1  Worry too much - different things 0 3 3  Trouble relaxing 0 1 3  Restless 0 1 3  Easily annoyed or irritable 0 1 3  Afraid - awful might happen 0 1 3  Total GAD 7 Score 0 9 17  Anxiety Difficulty Not difficult at all Somewhat difficult Somewhat difficult   Soft tissue swelling:  she noticed  6-7 months ago she had some swelling to her chest.  She says the size changes but is always there. She says it does not cause any pain.  No redness.   Obtained ultrasound and it was negative. Recommend that she have cross section imaging done.  Will order ct scan.    Hep C;  she is scheduled August 20 at 330 pm with GI to start treatment.  No changes.   Relevant past medical, surgical, family and social history reviewed and updated as indicated. Interim medical history since our last visit reviewed. Allergies and medications reviewed and updated.  Review of Systems  Constitutional: Negative for fever or weight change.  Respiratory: Negative for cough and shortness of breath.   Cardiovascular: Negative for chest pain or palpitations.  Gastrointestinal: Negative for abdominal pain, no bowel changes.  Musculoskeletal: Negative for gait problem or joint swelling.  Skin: Negative for rash.  Neurological: Negative for dizziness or headache.  No other specific complaints in a complete review of systems (except as listed in HPI above).      Objective:    BP 124/72   Pulse 83   Temp 97.8 F (36.6 C) (Oral)   Resp 18   Ht 5\' 4"  (1.626 m)   Wt 238 lb 11.2 oz (108.3 kg)   SpO2 94%   BMI 40.97 kg/m   Wt Readings  from Last 3 Encounters:  04/30/23 238 lb 11.2 oz (108.3 kg)  03/26/23 230 lb 8 oz (104.6 kg)  02/26/23 232 lb 1.6 oz (105.3 kg)    Physical Exam  Constitutional: Patient appears well-developed and well-nourished. Obese  No distress.  HEENT: head atraumatic, normocephalic, pupils equal and reactive to light, neck supple Cardiovascular: Normal rate, regular rhythm and normal heart sounds.  No murmur heard. No BLE edema. Pulmonary/Chest: Effort normal and breath sounds normal. No respiratory distress. Abdominal: Soft.  There is no tenderness. Psychiatric: Patient has a normal mood and affect. behavior is normal. Judgment and thought content normal.  Results for orders placed or performed in visit on 02/26/23  CBC with Differential/Platelet  Result Value Ref Range   WBC 6.0 3.8 - 10.8 Thousand/uL   RBC 4.36 3.80 - 5.10 Million/uL   Hemoglobin 13.0 11.7 - 15.5 g/dL   HCT 16.1 09.6 - 04.5 %   MCV 89.4 80.0 - 100.0 fL   MCH 29.8 27.0 - 33.0 pg    MCHC 33.3 32.0 - 36.0 g/dL   RDW 40.9 81.1 - 91.4 %   Platelets 312 140 - 400 Thousand/uL   MPV 10.6 7.5 - 12.5 fL   Neutro Abs 3,192 1,500 - 7,800 cells/uL   Lymphs Abs 2,178 850 - 3,900 cells/uL   Absolute Monocytes 420 200 - 950 cells/uL   Eosinophils Absolute 180 15 - 500 cells/uL   Basophils Absolute 30 0 - 200 cells/uL   Neutrophils Relative % 53.2 %   Total Lymphocyte 36.3 %   Monocytes Relative 7.0 %   Eosinophils Relative 3.0 %   Basophils Relative 0.5 %  COMPLETE METABOLIC PANEL WITH GFR  Result Value Ref Range   Glucose, Bld 83 65 - 99 mg/dL   BUN 13 7 - 25 mg/dL   Creat 7.82 9.56 - 2.13 mg/dL   eGFR 086 > OR = 60 VH/QIO/9.62X5   BUN/Creatinine Ratio SEE NOTE: 6 - 22 (calc)   Sodium 138 135 - 146 mmol/L   Potassium 4.8 3.5 - 5.3 mmol/L   Chloride 105 98 - 110 mmol/L   CO2 26 20 - 32 mmol/L   Calcium 9.1 8.6 - 10.2 mg/dL   Total Protein 7.2 6.1 - 8.1 g/dL   Albumin 3.9 3.6 - 5.1 g/dL   Globulin 3.3 1.9 - 3.7 g/dL (calc)   AG Ratio 1.2 1.0 - 2.5 (calc)   Total Bilirubin 0.2 0.2 - 1.2 mg/dL   Alkaline phosphatase (APISO) 79 31 - 125 U/L   AST 38 (H) 10 - 30 U/L   ALT 34 (H) 6 - 29 U/L  Lipid panel  Result Value Ref Range   Cholesterol 157 <200 mg/dL   HDL 55 > OR = 50 mg/dL   Triglycerides 63 <284 mg/dL   LDL Cholesterol (Calc) 87 mg/dL (calc)   Total CHOL/HDL Ratio 2.9 <5.0 (calc)   Non-HDL Cholesterol (Calc) 102 <130 mg/dL (calc)  Hepatitis C antibody  Result Value Ref Range   Hepatitis C Ab REACTIVE (A) NON-REACTIVE  HIV Antibody (routine testing w rflx)  Result Value Ref Range   HIV 1&2 Ab, 4th Generation NON-REACTIVE NON-REACTIVE  Hepatitis C RNA quantitative  Result Value Ref Range   HCV RNA, PCR, QN 1,740,000 (H) NOT DETECTED IU/mL   HCV Quantitative Log 6.24 (H) NOT DETECTED Log IU/mL  POCT HgB A1C  Result Value Ref Range   Hemoglobin A1C 5.7 (A) 4.0 - 5.6 %   HbA1c POC (<> result, manual  entry)     HbA1c, POC (prediabetic range)     HbA1c, POC  (controlled diabetic range)        Assessment & Plan:   Problem List Items Addressed This Visit       Other   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    Continue hydroxyzine      Relevant Medications   hydrOXYzine (VISTARIL) 25 MG capsule   Anxiety    Continue hydroxyzine      Relevant Medications   hydrOXYzine (VISTARIL) 25 MG capsule   Insomnia due to other mental disorder    Continue hydroxyzine      Relevant Medications   hydrOXYzine (VISTARIL) 25 MG capsule   Other Visit Diagnoses     Hepatitis C antibody test positive       awaiting GI appointment   Soft tissue swelling of chest wall       no findings on ultrasound, will order ct based on recommendation from radiology   Relevant Orders   CT Chest Wo Contrast         Follow up plan: Return in about 6 months (around 10/31/2023) for follow up.

## 2023-04-30 NOTE — Assessment & Plan Note (Signed)
Continue hydroxyzine.

## 2023-05-02 ENCOUNTER — Other Ambulatory Visit: Payer: Self-pay | Admitting: Nurse Practitioner

## 2023-05-02 DIAGNOSIS — R222 Localized swelling, mass and lump, trunk: Secondary | ICD-10-CM

## 2023-05-05 ENCOUNTER — Other Ambulatory Visit: Payer: Self-pay | Admitting: Nurse Practitioner

## 2023-05-05 ENCOUNTER — Encounter: Payer: Self-pay | Admitting: Nurse Practitioner

## 2023-05-05 DIAGNOSIS — F331 Major depressive disorder, recurrent, moderate: Secondary | ICD-10-CM

## 2023-05-05 DIAGNOSIS — F419 Anxiety disorder, unspecified: Secondary | ICD-10-CM

## 2023-05-05 DIAGNOSIS — R222 Localized swelling, mass and lump, trunk: Secondary | ICD-10-CM

## 2023-05-05 MED ORDER — ESCITALOPRAM OXALATE 10 MG PO TABS
10.0000 mg | ORAL_TABLET | Freq: Every day | ORAL | 1 refills | Status: DC
Start: 2023-05-05 — End: 2023-10-31

## 2023-05-05 NOTE — Telephone Encounter (Signed)
Medication Refill - Medication: Escitalopram 10 MG   Has the patient contacted their pharmacy? Yes.   (Agent: If no, request that the patient contact the pharmacy for the refill. If patient does not wish to contact the pharmacy document the reason why and proceed with request.) (Agent: If yes, when and what did the pharmacy advise?)  Preferred Pharmacy (with phone number or street name):  [facility] [address]), [place] - [address]  [address]) [place] [address]  Phone: [phone] Fax: [phone]   Has the patient been seen for an appointment in the last year OR does the patient have an upcoming appointment? Yes.    Agent: Please be advised that RX refills may take up to 3 business days. We ask that you follow-up with your pharmacy.

## 2023-05-06 ENCOUNTER — Encounter: Payer: Self-pay | Admitting: Gastroenterology

## 2023-05-06 ENCOUNTER — Ambulatory Visit (INDEPENDENT_AMBULATORY_CARE_PROVIDER_SITE_OTHER): Payer: 59 | Admitting: Gastroenterology

## 2023-05-06 VITALS — BP 135/88 | HR 58 | Temp 98.3°F | Ht 64.0 in | Wt 238.0 lb

## 2023-05-06 DIAGNOSIS — B1920 Unspecified viral hepatitis C without hepatic coma: Secondary | ICD-10-CM | POA: Diagnosis not present

## 2023-05-06 NOTE — Progress Notes (Signed)
Gastroenterology Consultation  Referring Provider:     Berniece Salines, FNP Primary Care Physician:  Berniece Salines, FNP Primary Gastroenterologist:  Dr. Servando Snare     Reason for Consultation:     Hepatitis C        HPI:   Crystal Jacobson is a 37 y.o. y/o female referred for consultation & management of hepatitis C by Dr. Zane Herald, Rudolpho Sevin, FNP.  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  The patient has been clean for some time and was found to have hepatitis C antibodies positive.  The patient reports that her present husband is negative for hepatitis C.  She has a positive viral load but has not had a genotype sent off.  The patient denies any other acute issues at this time.  No past medical history on file.  Past Surgical History:  Procedure Laterality Date   TUBAL LIGATION      Prior to Admission medications   Medication Sig Start Date End Date Taking? Authorizing Provider  escitalopram (LEXAPRO) 10 MG tablet Take 1 tablet (10 mg total) by mouth daily. 05/05/23   Berniece Salines, FNP  hydrOXYzine (VISTARIL) 25 MG capsule Take 1 capsule (25 mg total) by mouth at bedtime as needed. 04/30/23   Berniece Salines, FNP  metFORMIN (GLUCOPHAGE) 500 MG tablet Take 1 tablet (500 mg total) by mouth 2 (two) times daily with a meal. 02/26/23   Berniece Salines, FNP    Family History  Problem Relation Age of Onset   Diabetes Mother    Hypertension Father    Diabetes Father      Social History   Tobacco Use   Smoking status: Every Day    Current packs/day: 0.50    Types: Cigarettes   Smokeless tobacco: Never  Vaping Use   Vaping status: Never Used  Substance Use Topics   Alcohol use: No   Drug use: Never    Allergies as of 05/06/2023 - Review Complete 05/06/2023  Allergen Reaction Noted   Sulfa antibiotics  02/24/2019    Review of Systems:    All systems reviewed and negative except where noted in HPI.   Physical Exam:  There were no  vitals taken for this visit. No LMP recorded. General:   Alert,  Well-developed, well-nourished, pleasant and cooperative in NAD Head:  Normocephalic and atraumatic. Eyes:  Sclera clear, no icterus.   Conjunctiva pink. Ears:  Normal auditory acuity. Neurologic:  Alert and oriented x3;  grossly normal neurologically. Skin:  Intact without significant lesions or rashes.  No jaundice. Psych:  Alert and cooperative. Normal mood and affect.  Imaging Studies: No results found.  Assessment and Plan:   Crystal Jacobson is a 37 y.o. y/o female who comes in today with a hepatitis C antibody positive and viral load positive.  The patient will have lab work sent off to assess the patient's susceptibility to hepatitis A and hepatitis B and will be vaccinated accordingly.  The patient will also have her lab sent off for other possible cause of abnormal liver enzymes and will have a fibrosis score calculated.  The patient will also have a genotype sent off.  When these results are back the patient has been told that at that time we can start her on treatment for her hepatitis C.  The patient has been explained the plan and agrees with it.    Midge Minium, MD.  FACG    Note: This dictation was prepared with Dragon dictation along with smaller phrase technology. Any transcriptional errors that result from this process are unintentional.

## 2023-05-07 NOTE — Telephone Encounter (Signed)
Refilled 05/05/23 by provider Requested Prescriptions  Pending Prescriptions Disp Refills   escitalopram (LEXAPRO) 10 MG tablet 30 tablet 0    Sig: Take 1 tablet (10 mg total) by mouth daily.     Psychiatry:  Antidepressants - SSRI Passed - 05/05/2023  2:42 PM      Passed - Completed PHQ-2 or PHQ-9 in the last 360 days      Passed - Valid encounter within last 6 months    Recent Outpatient Visits           1 week ago Moderate episode of recurrent major depressive disorder [facility])   [facility] Berniece Salines, FNP   1 month ago Soft tissue swelling of chest wall   [facility] Berniece Salines, FNP   2 months ago Elevated blood sugar   [facility] Berniece Salines, FNP       Future Appointments             In 5 months Zane Herald, Rudolpho Sevin, FNP [facility], [facility]

## 2023-05-09 LAB — IRON,TIBC AND FERRITIN PANEL
Ferritin: 36 ng/mL (ref 15–150)
Iron Saturation: 19 % (ref 15–55)
Iron: 78 ug/dL (ref 27–159)
Total Iron Binding Capacity: 403 ug/dL (ref 250–450)
UIBC: 325 ug/dL (ref 131–425)

## 2023-05-09 LAB — HCV FIBROSURE
ALPHA 2-MACROGLOBULINS, QN: 234 mg/dL (ref 110–276)
ALT (SGPT) P5P: 33 IU/L (ref 0–40)
Apolipoprotein A-1: 142 mg/dL (ref 116–209)
Bilirubin, Total: 0.2 mg/dL (ref 0.0–1.2)
Fibrosis Score: 0.07 (ref 0.00–0.21)
GGT: 15 IU/L (ref 0–60)
Haptoglobin: 116 mg/dL (ref 33–278)
Necroinflammat Activity Score: 0.12 (ref 0.00–0.17)

## 2023-05-09 LAB — ANA: Anti Nuclear Antibody (ANA): NEGATIVE

## 2023-05-09 LAB — ALPHA-1-ANTITRYPSIN: A-1 Antitrypsin: 166 mg/dL (ref 100–188)

## 2023-05-09 LAB — HEPATITIS B SURFACE ANTIGEN: Hepatitis B Surface Ag: NEGATIVE

## 2023-05-09 LAB — MITOCHONDRIAL ANTIBODIES: Mitochondrial Ab: <20 U (ref 0.0–20.0)

## 2023-05-09 LAB — CERULOPLASMIN: Ceruloplasmin: 33.2 mg/dL (ref 19.0–39.0)

## 2023-05-09 LAB — ANTI-SMOOTH MUSCLE ANTIBODY, IGG: Smooth Muscle Ab: 9 U (ref 0–19)

## 2023-05-09 LAB — HEPATITIS B SURFACE ANTIBODY,QUALITATIVE: Hep B Surface Ab, Qual: NONREACTIVE

## 2023-05-09 LAB — HEPATITIS A ANTIBODY, TOTAL: hep A Total Ab: NEGATIVE

## 2023-05-09 LAB — HEPATITIS C GENOTYPE

## 2023-05-15 ENCOUNTER — Telehealth: Payer: Self-pay

## 2023-05-15 NOTE — Telephone Encounter (Signed)
Form faxed to [employer] for Epclusa 12 week treatment

## 2023-05-16 ENCOUNTER — Ambulatory Visit
Admission: RE | Admit: 2023-05-16 | Discharge: 2023-05-16 | Disposition: A | Discharge status: Home / Self Care | Payer: 59 | Source: Ambulatory Visit | Attending: Nurse Practitioner | Admitting: Nurse Practitioner

## 2023-05-16 DIAGNOSIS — R222 Localized swelling, mass and lump, trunk: Secondary | ICD-10-CM | POA: Diagnosis not present

## 2023-05-16 MED ORDER — IOPAMIDOL (ISOVUE-300) INJECTION 61%
100.0000 mL | Freq: Once | INTRAVENOUS | Status: AC | PRN
  Administered 2023-05-16: 100 mL via INTRAVENOUS

## 2023-05-20 ENCOUNTER — Ambulatory Visit (INDEPENDENT_AMBULATORY_CARE_PROVIDER_SITE_OTHER): Payer: 59

## 2023-05-20 DIAGNOSIS — Z23 Encounter for immunization: Secondary | ICD-10-CM | POA: Diagnosis not present

## 2023-05-20 NOTE — Progress Notes (Signed)
Per orders of Dr. Servando Snare, injection of Hep A/B given in Right Deltoid by Zymeir Salminen, Brendia Sacks.  Patient tolerated injection well.   2 of 3 scheduled for 06/24/23

## 2023-06-05 NOTE — Telephone Encounter (Signed)
T Matteucci 12/10/1985 was approved for Epclusa thru 08/26/2023. Patient has [phone] copay but is eligible for copay card so should bring it to $5. We are reaching out to discuss shipment shortly.

## 2023-06-23 ENCOUNTER — Encounter: Payer: Self-pay | Admitting: Nurse Practitioner

## 2023-06-24 ENCOUNTER — Ambulatory Visit (INDEPENDENT_AMBULATORY_CARE_PROVIDER_SITE_OTHER): Payer: 59

## 2023-06-24 DIAGNOSIS — Z23 Encounter for immunization: Secondary | ICD-10-CM | POA: Diagnosis not present

## 2023-06-24 NOTE — Progress Notes (Signed)
Per orders of Dr. Servando Snare, injection 2 of 3 Hep A/B given in left deltoid by Roena Malady.  Patient tolerated injection well.   3 of 3 scheduled for Feb 2025

## 2023-07-10 DIAGNOSIS — I10 Essential (primary) hypertension: Secondary | ICD-10-CM | POA: Diagnosis not present

## 2023-07-10 DIAGNOSIS — M1990 Unspecified osteoarthritis, unspecified site: Secondary | ICD-10-CM | POA: Diagnosis not present

## 2023-07-10 DIAGNOSIS — Z8249 Family history of ischemic heart disease and other diseases of the circulatory system: Secondary | ICD-10-CM | POA: Diagnosis not present

## 2023-07-10 DIAGNOSIS — Z809 Family history of malignant neoplasm, unspecified: Secondary | ICD-10-CM | POA: Diagnosis not present

## 2023-07-10 DIAGNOSIS — B182 Chronic viral hepatitis C: Secondary | ICD-10-CM | POA: Diagnosis not present

## 2023-07-10 DIAGNOSIS — Z7984 Long term (current) use of oral hypoglycemic drugs: Secondary | ICD-10-CM | POA: Diagnosis not present

## 2023-07-10 DIAGNOSIS — Z882 Allergy status to sulfonamides status: Secondary | ICD-10-CM | POA: Diagnosis not present

## 2023-07-10 DIAGNOSIS — F1121 Opioid dependence, in remission: Secondary | ICD-10-CM | POA: Diagnosis not present

## 2023-07-10 DIAGNOSIS — Z720 Tobacco use: Secondary | ICD-10-CM | POA: Diagnosis not present

## 2023-07-10 DIAGNOSIS — Z833 Family history of diabetes mellitus: Secondary | ICD-10-CM | POA: Diagnosis not present

## 2023-07-10 DIAGNOSIS — F419 Anxiety disorder, unspecified: Secondary | ICD-10-CM | POA: Diagnosis not present

## 2023-07-10 DIAGNOSIS — G629 Polyneuropathy, unspecified: Secondary | ICD-10-CM | POA: Diagnosis not present

## 2023-09-08 ENCOUNTER — Encounter: Payer: Self-pay | Admitting: Gastroenterology

## 2023-10-14 ENCOUNTER — Ambulatory Visit (INDEPENDENT_AMBULATORY_CARE_PROVIDER_SITE_OTHER): Payer: 59 | Admitting: Gastroenterology

## 2023-10-14 DIAGNOSIS — Z23 Encounter for immunization: Secondary | ICD-10-CM

## 2023-10-15 NOTE — Progress Notes (Signed)
Per orders of Dr. Servando Snare, injection of Hep A/B given by Adela Ports. Patient tolerated injection well.

## 2023-10-28 ENCOUNTER — Ambulatory Visit: Payer: 59

## 2023-10-31 ENCOUNTER — Ambulatory Visit (INDEPENDENT_AMBULATORY_CARE_PROVIDER_SITE_OTHER): Payer: 59 | Admitting: Nurse Practitioner

## 2023-10-31 ENCOUNTER — Encounter: Payer: Self-pay | Admitting: Nurse Practitioner

## 2023-10-31 VITALS — BP 122/84 | HR 94 | Temp 97.8°F | Resp 18 | Ht 64.0 in | Wt 270.0 lb

## 2023-10-31 DIAGNOSIS — R7989 Other specified abnormal findings of blood chemistry: Secondary | ICD-10-CM | POA: Diagnosis not present

## 2023-10-31 DIAGNOSIS — B1920 Unspecified viral hepatitis C without hepatic coma: Secondary | ICD-10-CM

## 2023-10-31 DIAGNOSIS — F331 Major depressive disorder, recurrent, moderate: Secondary | ICD-10-CM | POA: Diagnosis not present

## 2023-10-31 DIAGNOSIS — F419 Anxiety disorder, unspecified: Secondary | ICD-10-CM

## 2023-10-31 DIAGNOSIS — Z6839 Body mass index (BMI) 39.0-39.9, adult: Secondary | ICD-10-CM | POA: Diagnosis not present

## 2023-10-31 DIAGNOSIS — R7303 Prediabetes: Secondary | ICD-10-CM

## 2023-10-31 DIAGNOSIS — F5105 Insomnia due to other mental disorder: Secondary | ICD-10-CM | POA: Diagnosis not present

## 2023-10-31 DIAGNOSIS — E66812 Obesity, class 2: Secondary | ICD-10-CM

## 2023-10-31 DIAGNOSIS — Z130 Encounter for screening for diseases of the blood and blood-forming organs and certain disorders involving the immune mechanism: Secondary | ICD-10-CM | POA: Diagnosis not present

## 2023-10-31 DIAGNOSIS — F99 Mental disorder, not otherwise specified: Secondary | ICD-10-CM

## 2023-10-31 MED ORDER — BUPROPION HCL ER (XL) 150 MG PO TB24: 150.0000 mg | ORAL_TABLET | Freq: Every day | ORAL | 0 refills | Status: DC

## 2023-10-31 MED ORDER — ESCITALOPRAM OXALATE 10 MG PO TABS
10.0000 mg | ORAL_TABLET | Freq: Every day | ORAL | 1 refills | Status: DC
Start: 2023-10-31 — End: 2024-05-18

## 2023-10-31 NOTE — Assessment & Plan Note (Addendum)
Pt is currently taking lexapro 10 mg daily. Patient reports her anxiety is well controlled.

## 2023-10-31 NOTE — Assessment & Plan Note (Addendum)
Pt is currently taking lexapro 10 mg daily. Will start wellbutrin xl 150mg  daily for further assistance. Will follow up in 1 month to monitor tolerance and effectiveness of medication

## 2023-10-31 NOTE — Assessment & Plan Note (Signed)
Pt will work on sleep hygiene.

## 2023-10-31 NOTE — Progress Notes (Signed)
BP 122/84   Pulse 94   Temp 97.8 F (36.6 C)   Resp 18   Ht 5\' 4"  (1.626 m)   Wt 270 lb (122.5 kg)   LMP 10/04/2023   SpO2 98%   BMI 46.35 kg/m    Subjective:    Patient ID: Crystal Jacobson, female    DOB: 03/30/1986, 38 y.o.   MRN: [id number]  Chief Complaint  Patient presents with   Medical Management of Chronic Issues    HPI: Crystal Jacobson is a 38 y.o. female Pt returns to clinic for 6 month follow up visit. Pt is concerned about her mood and wants to adjust medication. Pt is currently taking lexapro 10mg  daily, and states medication is helping, but would like more assistance. Pt is also concerned about weight gain over the past year.  HEP C: patient had an active hep c infection, was referred to GI and has completed treatment as of 08/2023. She is scheduled to go back in December 2025 for repeat labs. Patient reports she is feeling great.       10/31/2023   10:06 AM 04/30/2023   10:53 AM 03/26/2023    9:57 AM  Depression screen PHQ 2/9  Decreased Interest 0 0 2  Down, Depressed, Hopeless 0 0 3  PHQ - 2 Score 0 0 5  Altered sleeping 0 0 1  Tired, decreased energy 1 0 1  Change in appetite 3 0 0  Feeling bad or failure about yourself  0 0 1  Trouble concentrating 0 0 1  Moving slowly or fidgety/restless 0 0 0  Suicidal thoughts 0 0 0  PHQ-9 Score 4 0 9  Difficult doing work/chores Somewhat difficult Not difficult at all Somewhat difficult       10/31/2023   10:08 AM 04/30/2023   10:53 AM 03/26/2023    9:58 AM 02/26/2023   10:07 AM  GAD 7 : Generalized Anxiety Score  Nervous, Anxious, on Edge 1 0 2 1  Control/stop worrying 2 0 0 1  Worry too much - different things 2 0 3 3  Trouble relaxing 2 0 1 3  Restless  0 1 3  Easily annoyed or irritable 0 0 1 3  Afraid - awful might happen 0 0 1 3  Total GAD 7 Score  0 9 17  Anxiety Difficulty Somewhat difficult Not difficult at all Somewhat difficult Somewhat difficult     Relevant past medical, surgical, family  and social history reviewed and updated as indicated. Interim medical history since our last visit reviewed. Allergies and medications reviewed and updated.  Review of Systems Constitutional: Negative for fever or weight change.  Respiratory: Negative for cough and shortness of breath.   Cardiovascular: Negative for chest pain or palpitations.  Gastrointestinal: Negative for abdominal pain, no bowel changes.  Musculoskeletal: Negative for gait problem or joint swelling.  Skin: Negative for rash.  Neurological: Negative for dizziness or headache.  No other specific complaints in a complete review of systems (except as listed in HPI above).        Objective:    BP 122/84   Pulse 94   Temp 97.8 F (36.6 C)   Resp 18   Ht 5\' 4"  (1.626 m)   Wt 270 lb (122.5 kg)   LMP 10/04/2023   SpO2 98%   BMI 46.35 kg/m    Wt Readings from Last 3 Encounters:  10/31/23 270 lb (122.5 kg)  05/06/23 238 lb (108 kg)  04/30/23 238  lb 11.2 oz (108.3 kg)    Physical Exam Vitals reviewed.  Constitutional:      Appearance: Normal appearance.  HENT:     Head: Normocephalic.  Cardiovascular:     Rate and Rhythm: Normal rate and regular rhythm.  Pulmonary:     Effort: Pulmonary effort is normal.     Breath sounds: Normal breath sounds.  Musculoskeletal:        General: Normal range of motion.  Skin:    General: Skin is warm and dry.  Neurological:     General: No focal deficit present.     Mental Status: She is alert and oriented to person, place, and time. Mental status is at baseline.  Psychiatric:        Mood and Affect: Mood normal.        Behavior: Behavior normal.        Thought Content: Thought content normal.        Judgment: Judgment normal.     Results for orders placed or performed in visit on 05/06/23  HCV FibroSURE   Collection Time: 05/06/23  3:52 PM  Result Value Ref Range   Fibrosis Score 0.07 0.00 - 0.21   Fibrosis Stage Comment    Necroinflammat Activity Score 0.12  0.00 - 0.17   Necroinflammat Activity Grade A0-No activity    Methodology: Comment    ALPHA 2-MACROGLOBULINS, QN 234 110 - 276 mg/dL   Haptoglobin 409 33 - 278 mg/dL   Apolipoprotein A-1 811 116 - 209 mg/dL   Bilirubin, Total 0.2 0.0 - 1.2 mg/dL   GGT 15 0 - 60 IU/L   ALT (SGPT) P5P 33 0 - 40 IU/L   Interpretations: Comment    Fibrosis Scoring: Comment    Necroinflamm Activity Scoring: Comment    Limitations: Comment    Comment Comment   Anti-smooth muscle antibody, IgG   Collection Time: 05/06/23  3:52 PM  Result Value Ref Range   Smooth Muscle Ab 9 0 - 19 Units  ANA   Collection Time: 05/06/23  3:52 PM  Result Value Ref Range   Anti Nuclear Antibody (ANA) Negative Negative  Alpha-1-antitrypsin   Collection Time: 05/06/23  3:52 PM  Result Value Ref Range   A-1 Antitrypsin 166 100 - 188 mg/dL  Ceruloplasmin   Collection Time: 05/06/23  3:52 PM  Result Value Ref Range   Ceruloplasmin 33.2 19.0 - 39.0 mg/dL  Hepatitis C genotype   Collection Time: 05/06/23  3:52 PM  Result Value Ref Range   Hepatitis C Genotype 1b   Iron, TIBC and Ferritin Panel   Collection Time: 05/06/23  3:52 PM  Result Value Ref Range   Total Iron Binding Capacity 403 250 - 450 ug/dL   UIBC 914 782 - 956 ug/dL   Iron 78 27 - 213 ug/dL   Iron Saturation 19 15 - 55 %   Ferritin 36 15 - 150 ng/mL  Mitochondrial antibodies   Collection Time: 05/06/23  3:52 PM  Result Value Ref Range   Mitochondrial Ab <20.0 0.0 - 20.0 Units  Hepatitis A antibody, total   Collection Time: 05/06/23  3:52 PM  Result Value Ref Range   hep A Total Ab Negative Negative  Hepatitis B surface antibody,qualitative   Collection Time: 05/06/23  3:52 PM  Result Value Ref Range   Hep B Surface Ab, Qual Non Reactive   Hepatitis B surface antigen   Collection Time: 05/06/23  3:52 PM  Result Value Ref Range  Hepatitis B Surface Ag Negative Negative       Assessment & Plan:   Problem List Items Addressed This Visit        Other   Prediabetes - Primary   Pt is not currently taking metformin medication d/t side effects of shakiness, and it managing condition with diet and exercise.  Labs drawn and pending.      Relevant Orders   COMPLETE METABOLIC PANEL WITH GFR   Hemoglobin A1c   Moderate episode of recurrent major depressive disorder (HCC)   Pt is currently taking lexapro 10 mg daily. Will start wellbutrin xl 150mg  daily for further assistance. Will follow up in 1 month to monitor tolerance and effectiveness of medication      Relevant Medications   buPROPion (WELLBUTRIN XL) 150 MG 24 hr tablet   escitalopram (LEXAPRO) 10 MG tablet   Anxiety   Pt is currently taking lexapro 10 mg daily. Patient reports her anxiety is well controlled.       Relevant Medications   buPROPion (WELLBUTRIN XL) 150 MG 24 hr tablet   escitalopram (LEXAPRO) 10 MG tablet   Insomnia due to other mental disorder   Pt will work on sleep hygiene.       Class 2 severe obesity due to excess calories with serious comorbidity and body mass index (BMI) of 39.0 to 39.9 in adult [facility])   Currently managing with diet and exercise. Will start patient on wellbutrin.  Commodities: Prediabetes       Relevant Orders   TSH   Other Visit Diagnoses       Screening for deficiency anemia       Relevant Orders   CBC with Differential/Platelet     Hepatitis C virus infection without hepatic coma, unspecified chronicity             Follow up plan: Return in about 4 weeks (around 11/28/2023) for follow up.

## 2023-10-31 NOTE — Assessment & Plan Note (Addendum)
Currently managing with diet and exercise. Will start patient on wellbutrin.  Commodities: Prediabetes

## 2023-10-31 NOTE — Assessment & Plan Note (Addendum)
Pt is not currently taking metformin medication d/t side effects of shakiness, and it managing condition with diet and exercise.  Labs drawn and pending.

## 2023-11-03 ENCOUNTER — Encounter: Payer: Self-pay | Admitting: Nurse Practitioner

## 2023-11-03 ENCOUNTER — Other Ambulatory Visit: Payer: Self-pay | Admitting: Nurse Practitioner

## 2023-11-03 DIAGNOSIS — R7989 Other specified abnormal findings of blood chemistry: Secondary | ICD-10-CM

## 2023-11-05 LAB — COMPLETE METABOLIC PANEL WITH GFR
AG Ratio: 1.1 (calc) (ref 1.0–2.5)
ALT: 8 U/L (ref 6–29)
AST: 14 U/L (ref 10–30)
Albumin: 3.9 g/dL (ref 3.6–5.1)
Alkaline phosphatase (APISO): 75 U/L (ref 31–125)
BUN: 15 mg/dL (ref 7–25)
CO2: 26 mmol/L (ref 20–32)
Calcium: 9.2 mg/dL (ref 8.6–10.2)
Chloride: 105 mmol/L (ref 98–110)
Creat: 0.69 mg/dL (ref 0.50–0.97)
Globulin: 3.5 g/dL (ref 1.9–3.7)
Glucose, Bld: 88 mg/dL (ref 65–99)
Potassium: 5.1 mmol/L (ref 3.5–5.3)
Sodium: 138 mmol/L (ref 135–146)
Total Bilirubin: 0.4 mg/dL (ref 0.2–1.2)
Total Protein: 7.4 g/dL (ref 6.1–8.1)
eGFR: 115 mL/min/{1.73_m2} (ref >=60)

## 2023-11-05 LAB — CBC WITH DIFFERENTIAL/PLATELET
Absolute Lymphocytes: 2470 {cells}/uL (ref 850–3900)
Absolute Monocytes: 471 {cells}/uL (ref 200–950)
Basophils Absolute: 38 {cells}/uL (ref 0–200)
Basophils Relative: 0.5 %
Eosinophils Absolute: 160 {cells}/uL (ref 15–500)
Eosinophils Relative: 2.1 %
HCT: 39.4 % (ref 35.0–45.0)
Hemoglobin: 13.2 g/dL (ref 11.7–15.5)
MCH: 30.3 pg (ref 27.0–33.0)
MCHC: 33.5 g/dL (ref 32.0–36.0)
MCV: 90.6 fL (ref 80.0–100.0)
MPV: 10.5 fL (ref 7.5–12.5)
Monocytes Relative: 6.2 %
Neutro Abs: 4461 {cells}/uL (ref 1500–7800)
Neutrophils Relative %: 58.7 %
Platelets: 312 10*3/uL (ref 140–400)
RBC: 4.35 10*6/uL (ref 3.80–5.10)
RDW: 12.4 % (ref 11.0–15.0)
Total Lymphocyte: 32.5 %
WBC: 7.6 10*3/uL (ref 3.8–10.8)

## 2023-11-05 LAB — HEMOGLOBIN A1C
Hgb A1c MFr Bld: 5.7 %{Hb} — ABNORMAL HIGH (ref <5.7)
Mean Plasma Glucose: 117 mg/dL
eAG (mmol/L): 6.5 mmol/L

## 2023-11-05 LAB — T3, FREE: T3, Free: 3.2 pg/mL (ref 2.3–4.2)

## 2023-11-05 LAB — TEST AUTHORIZATION

## 2023-11-05 LAB — T4, FREE: Free T4: 1 ng/dL (ref 0.8–1.8)

## 2023-11-05 LAB — THYROID PEROXIDASE ANTIBODY: Thyroperoxidase Ab SerPl-aCnc: 1 [IU]/mL (ref <9)

## 2023-11-05 LAB — TSH: TSH: 5.09 m[IU]/L — ABNORMAL HIGH

## 2023-11-27 NOTE — Progress Notes (Unsigned)
 LMP 10/04/2023    Subjective:    Patient ID: Crystal Jacobson, female    DOB: 1985/11/09, 38 y.o.   MRN: [id number]  HPI: Crystal Jacobson is a 38 y.o. female  No chief complaint on file.   Discussed the use of AI scribe software for clinical note transcription with the patient, who gave verbal consent to proceed.  History of Present Illness           10/31/2023   10:06 AM 04/30/2023   10:53 AM 03/26/2023    9:57 AM  Depression screen PHQ 2/9  Decreased Interest 0 0 2  Down, Depressed, Hopeless 0 0 3  PHQ - 2 Score 0 0 5  Altered sleeping 0 0 1  Tired, decreased energy 1 0 1  Change in appetite 3 0 0  Feeling bad or failure about yourself  0 0 1  Trouble concentrating 0 0 1  Moving slowly or fidgety/restless 0 0 0  Suicidal thoughts 0 0 0  PHQ-9 Score 4 0 9  Difficult doing work/chores Somewhat difficult Not difficult at all Somewhat difficult    Relevant past medical, surgical, family and social history reviewed and updated as indicated. Interim medical history since our last visit reviewed. Allergies and medications reviewed and updated.  Review of Systems  Per HPI unless specifically indicated above     Objective:    LMP 10/04/2023   {Vitals History (Optional):23777} Wt Readings from Last 3 Encounters:  10/31/23 270 lb (122.5 kg)  05/06/23 238 lb (108 kg)  04/30/23 238 lb 11.2 oz (108.3 kg)    Physical Exam  Results for orders placed or performed in visit on 10/31/23  CBC with Differential/Platelet   Collection Time: 10/31/23 10:41 AM  Result Value Ref Range   WBC 7.6 3.8 - 10.8 Thousand/uL   RBC 4.35 3.80 - 5.10 Million/uL   Hemoglobin 13.2 11.7 - 15.5 g/dL   HCT 78.2 95.6 - 21.3 %   MCV 90.6 80.0 - 100.0 fL   MCH 30.3 27.0 - 33.0 pg   MCHC 33.5 32.0 - 36.0 g/dL   RDW 08.6 57.8 - 46.9 %   Platelets 312 140 - 400 Thousand/uL   MPV 10.5 7.5 - 12.5 fL   Neutro Abs 4,461 1,500 - 7,800 cells/uL   Absolute Lymphocytes 2,470 850 - 3,900 cells/uL    Absolute Monocytes 471 200 - 950 cells/uL   Eosinophils Absolute 160 15 - 500 cells/uL   Basophils Absolute 38 0 - 200 cells/uL   Neutrophils Relative % 58.7 %   Total Lymphocyte 32.5 %   Monocytes Relative 6.2 %   Eosinophils Relative 2.1 %   Basophils Relative 0.5 %  COMPLETE METABOLIC PANEL WITH GFR   Collection Time: 10/31/23 10:41 AM  Result Value Ref Range   Glucose, Bld 88 65 - 99 mg/dL   BUN 15 7 - 25 mg/dL   Creat 6.29 5.28 - 4.13 mg/dL   eGFR 244 > OR = 60 WN/UUV/2.53G6   BUN/Creatinine Ratio SEE NOTE: 6 - 22 (calc)   Sodium 138 135 - 146 mmol/L   Potassium 5.1 3.5 - 5.3 mmol/L   Chloride 105 98 - 110 mmol/L   CO2 26 20 - 32 mmol/L   Calcium 9.2 8.6 - 10.2 mg/dL   Total Protein 7.4 6.1 - 8.1 g/dL   Albumin 3.9 3.6 - 5.1 g/dL   Globulin 3.5 1.9 - 3.7 g/dL (calc)   AG Ratio 1.1 1.0 - 2.5 (calc)   Total  Bilirubin 0.4 0.2 - 1.2 mg/dL   Alkaline phosphatase (APISO) 75 31 - 125 U/L   AST 14 10 - 30 U/L   ALT 8 6 - 29 U/L  TSH   Collection Time: 10/31/23 10:41 AM  Result Value Ref Range   TSH 5.09 (H) mIU/L  Hemoglobin A1c   Collection Time: 10/31/23 10:41 AM  Result Value Ref Range   Hgb A1c MFr Bld 5.7 (H) <5.7 % of total Hgb   Mean Plasma Glucose 117 mg/dL   eAG (mmol/L) 6.5 mmol/L  T4, free   Collection Time: 10/31/23 10:41 AM  Result Value Ref Range   Free T4 1.0 0.8 - 1.8 ng/dL  T3, free   Collection Time: 10/31/23 10:41 AM  Result Value Ref Range   T3, Free 3.2 2.3 - 4.2 pg/mL  Thyroid peroxidase antibody   Collection Time: 10/31/23 10:41 AM  Result Value Ref Range   Thyroperoxidase Ab SerPl-aCnc 1 <9 IU/mL  TEST AUTHORIZATION   Collection Time: 10/31/23 10:41 AM  Result Value Ref Range   TEST NAME: T3, FREE THYROID PEROXIDASE T    TEST CODE: [id number]    CLIENT CONTACT: LESLIE SMITH    REPORT ALWAYS MESSAGE SIGNATURE     {Labs (Optional):23779}    Assessment & Plan:   Problem List Items Addressed This Visit   None     Assessment and Plan             Follow up plan: No follow-ups on file.

## 2023-11-28 ENCOUNTER — Ambulatory Visit (INDEPENDENT_AMBULATORY_CARE_PROVIDER_SITE_OTHER): Payer: 59 | Admitting: Nurse Practitioner

## 2023-11-28 ENCOUNTER — Encounter: Payer: Self-pay | Admitting: Nurse Practitioner

## 2023-11-28 VITALS — BP 120/74 | HR 89 | Temp 97.8°F | Ht 64.0 in | Wt 272.8 lb

## 2023-11-28 DIAGNOSIS — Z23 Encounter for immunization: Secondary | ICD-10-CM | POA: Diagnosis not present

## 2023-11-28 DIAGNOSIS — E038 Other specified hypothyroidism: Secondary | ICD-10-CM | POA: Diagnosis not present

## 2023-11-28 DIAGNOSIS — F331 Major depressive disorder, recurrent, moderate: Secondary | ICD-10-CM

## 2023-11-28 DIAGNOSIS — F419 Anxiety disorder, unspecified: Secondary | ICD-10-CM

## 2023-12-16 DIAGNOSIS — E1142 Type 2 diabetes mellitus with diabetic polyneuropathy: Secondary | ICD-10-CM | POA: Diagnosis not present

## 2023-12-16 DIAGNOSIS — Z833 Family history of diabetes mellitus: Secondary | ICD-10-CM | POA: Diagnosis not present

## 2023-12-16 DIAGNOSIS — F1121 Opioid dependence, in remission: Secondary | ICD-10-CM | POA: Diagnosis not present

## 2023-12-16 DIAGNOSIS — Z6841 Body Mass Index (BMI) 40.0 and over, adult: Secondary | ICD-10-CM | POA: Diagnosis not present

## 2023-12-16 DIAGNOSIS — Z809 Family history of malignant neoplasm, unspecified: Secondary | ICD-10-CM | POA: Diagnosis not present

## 2023-12-16 DIAGNOSIS — I10 Essential (primary) hypertension: Secondary | ICD-10-CM | POA: Diagnosis not present

## 2023-12-16 DIAGNOSIS — F319 Bipolar disorder, unspecified: Secondary | ICD-10-CM | POA: Diagnosis not present

## 2023-12-16 DIAGNOSIS — Z720 Tobacco use: Secondary | ICD-10-CM | POA: Diagnosis not present

## 2023-12-16 DIAGNOSIS — Z882 Allergy status to sulfonamides status: Secondary | ICD-10-CM | POA: Diagnosis not present

## 2023-12-16 DIAGNOSIS — F419 Anxiety disorder, unspecified: Secondary | ICD-10-CM | POA: Diagnosis not present

## 2023-12-16 DIAGNOSIS — Z8249 Family history of ischemic heart disease and other diseases of the circulatory system: Secondary | ICD-10-CM | POA: Diagnosis not present

## 2024-01-05 ENCOUNTER — Encounter: Admitting: Nurse Practitioner

## 2024-01-26 ENCOUNTER — Encounter: Payer: Self-pay | Admitting: Nurse Practitioner

## 2024-02-13 ENCOUNTER — Ambulatory Visit: Admitting: Nurse Practitioner

## 2024-02-13 NOTE — Progress Notes (Deleted)
 There were no vitals taken for this visit.   Subjective:    Patient ID: Crystal Jacobson, female    DOB: October 29, 1985, 38 y.o.   MRN: [id number]  HPI: Crystal Jacobson is a 38 y.o. female  No chief complaint on file.   Discussed the use of AI scribe software for clinical note transcription with the patient, who gave verbal consent to proceed.  History of Present Illness          11/28/2023    9:31 AM 10/31/2023   10:06 AM 04/30/2023   10:53 AM  Depression screen PHQ 2/9  Decreased Interest 1 0 0  Down, Depressed, Hopeless 0 0 0  PHQ - 2 Score 1 0 0  Altered sleeping 0 0 0  Tired, decreased energy 1 1 0  Change in appetite 2 3 0  Feeling bad or failure about yourself  1 0 0  Trouble concentrating 0 0 0  Moving slowly or fidgety/restless 0 0 0  Suicidal thoughts 0 0 0  PHQ-9 Score 5 4 0  Difficult doing work/chores Somewhat difficult Somewhat difficult Not difficult at all    Relevant past medical, surgical, family and social history reviewed and updated as indicated. Interim medical history since our last visit reviewed. Allergies and medications reviewed and updated.  Review of Systems  Per HPI unless specifically indicated above     Objective:      There were no vitals taken for this visit.  {Vitals History (Optional):23777} Wt Readings from Last 3 Encounters:  11/28/23 272 lb 12.8 oz (123.7 kg)  10/31/23 270 lb (122.5 kg)  05/06/23 238 lb (108 kg)    Physical Exam Physical Exam    Results for orders placed or performed in visit on 10/31/23  CBC with Differential/Platelet   Collection Time: 10/31/23 10:41 AM  Result Value Ref Range   WBC 7.6 3.8 - 10.8 Thousand/uL   RBC 4.35 3.80 - 5.10 Million/uL   Hemoglobin 13.2 11.7 - 15.5 g/dL   HCT 40.9 81.1 - 91.4 %   MCV 90.6 80.0 - 100.0 fL   MCH 30.3 27.0 - 33.0 pg   MCHC 33.5 32.0 - 36.0 g/dL   RDW 78.2 95.6 - 21.3 %   Platelets 312 140 - 400 Thousand/uL   MPV 10.5 7.5 - 12.5 fL   Neutro Abs 4,461  1,500 - 7,800 cells/uL   Absolute Lymphocytes 2,470 850 - 3,900 cells/uL   Absolute Monocytes 471 200 - 950 cells/uL   Eosinophils Absolute 160 15 - 500 cells/uL   Basophils Absolute 38 0 - 200 cells/uL   Neutrophils Relative % 58.7 %   Total Lymphocyte 32.5 %   Monocytes Relative 6.2 %   Eosinophils Relative 2.1 %   Basophils Relative 0.5 %  COMPLETE METABOLIC PANEL WITH GFR   Collection Time: 10/31/23 10:41 AM  Result Value Ref Range   Glucose, Bld 88 65 - 99 mg/dL   BUN 15 7 - 25 mg/dL   Creat 0.86 5.78 - 4.69 mg/dL   eGFR 629 > OR = 60 BM/WUX/3.24M0   BUN/Creatinine Ratio SEE NOTE: 6 - 22 (calc)   Sodium 138 135 - 146 mmol/L   Potassium 5.1 3.5 - 5.3 mmol/L   Chloride 105 98 - 110 mmol/L   CO2 26 20 - 32 mmol/L   Calcium 9.2 8.6 - 10.2 mg/dL   Total Protein 7.4 6.1 - 8.1 g/dL   Albumin 3.9 3.6 - 5.1 g/dL   Globulin 3.5 1.9 -  3.7 g/dL (calc)   AG Ratio 1.1 1.0 - 2.5 (calc)   Total Bilirubin 0.4 0.2 - 1.2 mg/dL   Alkaline phosphatase (APISO) 75 31 - 125 U/L   AST 14 10 - 30 U/L   ALT 8 6 - 29 U/L  TSH   Collection Time: 10/31/23 10:41 AM  Result Value Ref Range   TSH 5.09 (H) mIU/L  Hemoglobin A1c   Collection Time: 10/31/23 10:41 AM  Result Value Ref Range   Hgb A1c MFr Bld 5.7 (H) <5.7 % of total Hgb   Mean Plasma Glucose 117 mg/dL   eAG (mmol/L) 6.5 mmol/L  T4, free   Collection Time: 10/31/23 10:41 AM  Result Value Ref Range   Free T4 1.0 0.8 - 1.8 ng/dL  T3, free   Collection Time: 10/31/23 10:41 AM  Result Value Ref Range   T3, Free 3.2 2.3 - 4.2 pg/mL  Thyroid  peroxidase antibody   Collection Time: 10/31/23 10:41 AM  Result Value Ref Range   Thyroperoxidase Ab SerPl-aCnc 1 <9 IU/mL  TEST AUTHORIZATION   Collection Time: 10/31/23 10:41 AM  Result Value Ref Range   TEST NAME: T3, FREE THYROID  PEROXIDASE T    TEST CODE: [id number]    CLIENT CONTACT: LESLIE SMITH    REPORT ALWAYS MESSAGE SIGNATURE     {Labs (Optional):23779}        Assessment & Plan:   Problem List Items Addressed This Visit   None    Assessment and Plan Assessment & Plan         Follow up plan: No follow-ups on file.

## 2024-05-17 ENCOUNTER — Other Ambulatory Visit: Payer: Self-pay | Admitting: Nurse Practitioner

## 2024-05-17 DIAGNOSIS — F419 Anxiety disorder, unspecified: Secondary | ICD-10-CM

## 2024-05-17 DIAGNOSIS — F331 Major depressive disorder, recurrent, moderate: Secondary | ICD-10-CM

## 2024-05-18 NOTE — Telephone Encounter (Signed)
 Requested Prescriptions  Pending Prescriptions Disp Refills   escitalopram  (LEXAPRO ) 10 MG tablet [Pharmacy Med Name: Escitalopram  Oxalate 10 MG Oral Tablet] 90 tablet 0    Sig: Take 1 tablet by mouth once daily     Psychiatry:  Antidepressants - SSRI Passed - 05/18/2024  2:13 PM      Passed - Completed PHQ-2 or PHQ-9 in the last 360 days      Passed - Valid encounter within last 6 months    Recent Outpatient Visits           5 months ago Moderate episode of recurrent major depressive disorder [facility])   [facility] Gareth Mliss FALCON, FNP   6 months ago Prediabetes   [facility] Gareth Mliss FAL[place], [place]

## 2024-08-16 ENCOUNTER — Other Ambulatory Visit: Payer: Self-pay | Admitting: Nurse Practitioner

## 2024-08-16 ENCOUNTER — Telehealth: Payer: Self-pay | Admitting: Family Medicine

## 2024-08-16 DIAGNOSIS — F331 Major depressive disorder, recurrent, moderate: Secondary | ICD-10-CM

## 2024-08-16 DIAGNOSIS — F419 Anxiety disorder, unspecified: Secondary | ICD-10-CM

## 2024-08-16 NOTE — Telephone Encounter (Signed)
 The patient called to schedule a follow-up appointment with Dr. Jinny. I informed her that she would first be scheduled with Dr. Grayce for lab work. Following that, we can schedule her with Dr. Melany in [place]. I also explained that if she preferred not to travel to [facility], she could wait for an appointment with Dr. Theophilus in February.

## 2024-08-19 NOTE — Telephone Encounter (Signed)
 Courtesy refill. Patient will need an office visit for additional refills.  Requested Prescriptions  Pending Prescriptions Disp Refills   escitalopram  (LEXAPRO ) 10 MG tablet [Pharmacy Med Name: Escitalopram  Oxalate 10 MG Oral Tablet] 90 tablet 0    Sig: Take 1 tablet by mouth once daily     Psychiatry:  Antidepressants - SSRI Failed - 08/19/2024 10:57 AM      Failed - Valid encounter within last 6 months    Recent Outpatient Visits           8 months ago Moderate episode of recurrent major depressive disorder [facility])   [facility] Gareth Mliss FALCON, FNP   9 months ago Prediabetes   [facility] Gareth Mliss FAL[place], [place]              Passed - Completed PHQ-2 or PHQ-9 in the last 360 days

## 2024-08-20 NOTE — Progress Notes (Signed)
 08/23/2024 Crystal Jacobson [id number] Jun 19, 1986  Gastroenterology Office Note     Primary Care Physician:  Gareth Mliss FALCON, FNP  Primary GI Provider: Jinny Carmine, MD    Chief Complaint   Chief Complaint  Patient presents with   Follow-up    Follow up on labs      History of Present Illness   Crystal Jacobson is a 38 y.o. female with PMHX of hepatitis C presenting today for follow-up.  Patient last seen by Dr. Edith on 05/06/2023 for management of hepatitis C.  Patient with history of IV drug use starting at the age of 55.  Her hepatitis C antibody was positive and viral load positive.   Today patient is here to have labs drawn and follow-up for hepatitis C infection that was treated 04/2023.  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  Denies any other GI issues at this time.   History reviewed. No pertinent past medical history.  Past Surgical History:  Procedure Laterality Date   TUBAL LIGATION      Current Outpatient Medications  Medication Sig Dispense Refill   escitalopram  (LEXAPRO ) 10 MG tablet Take 1 tablet by mouth once daily 30 tablet 0   No current facility-administered medications for this visit.    Allergies as of 08/23/2024 - Review Complete 08/23/2024  Allergen Reaction Noted   Sulfa antibiotics  02/24/2019    Family History  Problem Relation Age of Onset   Diabetes Mother    Hypertension Father    Diabetes Father     Social History   Socioeconomic History   Marital status: Divorced    Spouse name: Not on file   Number of children: 2   Years of education: Not on file   Highest education level: Not on file  Occupational History   Not on file  Tobacco Use   Smoking status: Every Day    Current packs/day: 0.50    Types: Cigarettes   Smokeless tobacco: Never  Vaping Use   Vaping status: Never Used  Substance and Sexual Activity    Alcohol use: No   Drug use: Never   Sexual activity: Not Currently  Other Topics Concern   Not on file  Social History Narrative   Works at [employer]   Social Drivers of Health   Financial Resource Strain: Not on file  Food Insecurity: Not on file  Transportation Needs: Not on file  Physical Activity: Not on file  Stress: Not on file  Social Connections: Not on file  Intimate Partner Violence: Not on file     RELEVANT GI HISTORY, IMAGING AND LABS: CBC    Component Value Date/Time   WBC 7.6 10/31/2023 1041   RBC 4.35 10/31/2023 1041   HGB 13.2 10/31/2023 1041   HCT 39.4 10/31/2023 1041   PLT 312 10/31/2023 1041   MCV 90.6 10/31/2023 1041   MCH 30.3 10/31/2023 1041   MCHC 33.5 10/31/2023 1041   RDW 12.4 10/31/2023 1041   LYMPHSABS 2,178 02/26/2023 1032   MONOABS 0.5 05/11/2020 1936   EOSABS 160 10/31/2023 1041   BASOSABS 38 10/31/2023 1041   Recent Labs    10/31/23 1041  HGB 13.2    CMP     Component Value Date/Time   NA 138 10/31/2023 1041   K 5.1 10/31/2023 1041   CL 105 10/31/2023 1041   CO2 26  10/31/2023 1041   GLUCOSE 88 10/31/2023 1041   BUN 15 10/31/2023 1041   CREATININE 0.69 10/31/2023 1041   CALCIUM 9.2 10/31/2023 1041   PROT 7.4 10/31/2023 1041   ALBUMIN 3.7 12/08/2020 1052   AST 14 10/31/2023 1041   ALT 8 10/31/2023 1041   ALKPHOS 71 12/08/2020 1052   BILITOT 0.4 10/31/2023 1041   GFRNONAA >60 12/08/2020 1052   GFRAA >60 05/11/2020 1936      Latest Ref Rng & Units 10/31/2023   10:41 AM 02/26/2023   10:32 AM 12/08/2020   10:52 AM  Hepatic Function  Total Protein 6.1 - 8.1 g/dL 7.4  7.2  7.8   Albumin 3.5 - 5.0 g/dL   3.7   AST 10 - 30 U/L 14  38  47   ALT 6 - 29 U/L 8  34  51   Alk Phosphatase 38 - 126 U/L   71   Total Bilirubin 0.2 - 1.2 mg/dL 0.4  0.2  0.9   Bilirubin, Direct 0.0 - 0.2 mg/dL   0.1       Latest Ref Rng & Units 02/26/2023   10:32 AM  Hepatitis C  HCV Quanitative Log NOT DETECTED Log IU/mL 6.24     Review of  Systems   All systems reviewed and negative except where noted in HPI.    Physical Exam  BP 121/83   Pulse 74   Temp 97.7 F (36.5 C)   Ht 5' 4 (1.626 m)   Wt 290 lb 3.2 oz (131.6 kg)   LMP 08/07/2024   SpO2 97%   BMI 49.81 kg/m  Patient's last menstrual period was 08/07/2024. General:   Alert and oriented. Pleasant and cooperative. Well-nourished and well-developed.  In no acute distress Head:  Normocephalic and atraumatic. Eyes:  Without icterus Ears:  Normal auditory acuity. Lungs:  Respirations even and unlabored.  Clear throughout to auscultation.   No wheezes, crackles, or rhonchi. No acute distress. Heart:  Regular rate and rhythm; no murmurs, clicks, rubs, or gallops. Abdomen:  Normal bowel sounds.  No bruits.  Soft, non-tender and non-distended without masses, hepatosplenomegaly or hernias noted.  No guarding or rebound tenderness.   Rectal:  Deferred. Msk:  Symmetrical without gross deformities. Normal posture. Extremities:  Without edema. Neurologic:  Alert and  oriented x4;  grossly normal neurologically. Skin:  Intact without significant lesions or rashes. Psych:  Alert and cooperative. Normal mood and affect.   Assessment & Plan   Crystal Jacobson is a 38 y.o. female presenting today for annual follow-up after being treated for hepatitis C.  Patient finished Epclusa on 09/08/2023. Patient also received vaccine for hepatitis A and B after testing positive for hepatitis C.  Patient denies ongoing IV drug use. I will check hepatic function panel and hep C RNA today to ensure sustained virologic response.  Patient has no further GI issues or concerns at this time.  Pending lab results, patient will only need to follow-up as needed.  Grayce Bohr, DNP, AGNP-[facility]

## 2024-08-23 ENCOUNTER — Ambulatory Visit: Admitting: Family Medicine

## 2024-08-23 ENCOUNTER — Encounter: Payer: Self-pay | Admitting: Family Medicine

## 2024-08-23 VITALS — BP 121/83 | HR 74 | Temp 97.7°F | Ht 64.0 in | Wt 290.2 lb

## 2024-08-23 DIAGNOSIS — Z8619 Personal history of other infectious and parasitic diseases: Secondary | ICD-10-CM | POA: Diagnosis not present

## 2024-08-23 DIAGNOSIS — B1920 Unspecified viral hepatitis C without hepatic coma: Secondary | ICD-10-CM | POA: Diagnosis not present

## 2024-08-24 ENCOUNTER — Ambulatory Visit: Payer: Self-pay | Admitting: Family Medicine

## 2024-08-24 LAB — HEPATIC FUNCTION PANEL
ALT: 9 IU/L (ref 0–32)
AST: 14 IU/L (ref 0–40)
Albumin: 3.8 g/dL — ABNORMAL LOW (ref 3.9–4.9)
Alkaline Phosphatase: 95 IU/L (ref 41–116)
Bilirubin Total: 0.3 mg/dL (ref 0.0–1.2)
Bilirubin, Direct: <0.08 mg/dL (ref 0.00–0.40)
Total Protein: 7.2 g/dL (ref 6.0–8.5)

## 2024-08-24 LAB — HCV RNA QUANT: Hepatitis C Quantitation: NOT DETECTED [IU]/mL

## 2024-09-08 ENCOUNTER — Encounter: Payer: Self-pay | Admitting: Nurse Practitioner

## 2024-09-08 ENCOUNTER — Telehealth: Payer: Self-pay | Admitting: Pharmacy Technician

## 2024-09-08 ENCOUNTER — Ambulatory Visit (INDEPENDENT_AMBULATORY_CARE_PROVIDER_SITE_OTHER): Admitting: Nurse Practitioner

## 2024-09-08 ENCOUNTER — Other Ambulatory Visit (HOSPITAL_COMMUNITY): Payer: Self-pay

## 2024-09-08 VITALS — BP 132/88 | HR 83 | Temp 97.6°F | Ht 64.0 in | Wt 288.0 lb

## 2024-09-08 DIAGNOSIS — E66812 Obesity, class 2: Secondary | ICD-10-CM | POA: Diagnosis not present

## 2024-09-08 DIAGNOSIS — Z6839 Body mass index (BMI) 39.0-39.9, adult: Secondary | ICD-10-CM | POA: Diagnosis not present

## 2024-09-08 DIAGNOSIS — Z130 Encounter for screening for diseases of the blood and blood-forming organs and certain disorders involving the immune mechanism: Secondary | ICD-10-CM

## 2024-09-08 DIAGNOSIS — L732 Hidradenitis suppurativa: Secondary | ICD-10-CM | POA: Insufficient documentation

## 2024-09-08 DIAGNOSIS — F331 Major depressive disorder, recurrent, moderate: Secondary | ICD-10-CM

## 2024-09-08 DIAGNOSIS — F419 Anxiety disorder, unspecified: Secondary | ICD-10-CM | POA: Diagnosis not present

## 2024-09-08 DIAGNOSIS — Z13220 Encounter for screening for lipoid disorders: Secondary | ICD-10-CM

## 2024-09-08 DIAGNOSIS — E038 Other specified hypothyroidism: Secondary | ICD-10-CM

## 2024-09-08 DIAGNOSIS — R7303 Prediabetes: Secondary | ICD-10-CM

## 2024-09-08 MED ORDER — ESCITALOPRAM OXALATE 20 MG PO TABS: 20.0000 mg | ORAL_TABLET | Freq: Every day | ORAL | 0 refills | Status: AC

## 2024-09-08 MED ORDER — CLINDAMYCIN PHOS (TWICE-DAILY) 1 % EX GEL: Freq: Two times a day (BID) | CUTANEOUS | 1 refills | Status: AC

## 2024-09-08 MED ORDER — WEGOVY 0.25 MG/0.5ML ~~LOC~~ SOAJ: 0.2500 mg | SUBCUTANEOUS | 0 refills | Status: DC

## 2024-09-08 NOTE — Telephone Encounter (Signed)
 Pharmacy Patient Advocate Encounter  Received notification from [facility] that Prior Authorization for Wegovy  0.25MG /0.5ML auto-injectors has been DENIED.  No reason given; No denial letter received via Fax or CMM. It has been requested and will be uploaded to the media tab once received.   PA #/Case ID/Reference #: [id number]

## 2024-09-08 NOTE — Progress Notes (Signed)
 "  BP 132/88   Pulse 83   Temp 97.6 F (36.4 C)   Ht 5' 4 (1.626 m)   Wt 288 lb (130.6 kg)   LMP 08/07/2024   SpO2 97%   BMI 49.44 kg/m    Subjective:    Patient ID: Crystal Jacobson, female    DOB: September 30, 1985, 38 y.o.   MRN: [id number]  HPI: Crystal Jacobson is a 38 y.o. female  Chief Complaint  Patient presents with   Medication Management    Pt states towards the end of the day she starts to feel irritable.    Weight Management Screening    Pt states she is getting sores under breast, belly and arms.    Discussed the use of AI scribe software for clinical note transcription with the patient, who gave verbal consent to proceed.  History of Present Illness Crystal Jacobson is a 38 year old female with hypothyroidism, depression, anxiety, obesity, and prediabetes who presents with concerns about medication efficacy and skin sores.  Psychiatric symptoms and medication efficacy - Increasing irritability by the end of the day while taking Lexapro  10 mg daily. - Previous experience with Wellbutrin  resulted in significant irritability. - Ongoing depression and anxiety despite current medication regimen.  Recurrent skin sores - Painful sores located under the breast, belly, and arm. - Sores have recurred since onset of weight gain. - One particularly painful sore under the belly prompted medical evaluation. - Mother experiences similar sores and recommended warm compresses for relief.  Obesity and weight management - Current weight is 288 pounds with BMI of 49.4. - Difficulty losing weight despite use of a walk machine and vibrating plate. - Frustration with inability to lose weight. - Back 'locks up' due to weight, impacting mood and stress levels. Wt Readings from Last 3 Encounters:  09/08/24 288 lb (130.6 kg)  08/23/24 290 lb 3.2 oz (131.6 kg)  11/28/23 272 lb 12.8 oz (123.7 kg)    Flowsheet Row Office Visit from 09/08/2024 in [facility]  1  50 inches     Thyroid  dysfunction - History of hypothyroidism. - Last thyroid  function test in February showed elevated TSH of 5.09. - No family history of thyroid  cancer.  Prediabetes - History of prediabetes. Lab Results  Component Value Date   HGBA1C 5.7 (H) 10/31/2023     Gynecologic status - Not pregnant and no plans for pregnancy.     Wt Readings from Last 3 Encounters:  09/08/24 288 lb (130.6 kg)  08/23/24 290 lb 3.2 oz (131.6 kg)  11/28/23 272 lb 12.8 oz (123.7 kg)   Body mass index is 49.44 kg/m.  Waist Measurement : 50 inches      09/08/2024    9:25 AM 11/28/2023    9:31 AM 10/31/2023   10:06 AM  Depression screen PHQ 2/9  Decreased Interest 2 1 0  Down, Depressed, Hopeless 0 0 0  PHQ - 2 Score 2 1 0  Altered sleeping 0 0 0  Tired, decreased energy 2 1 1   Change in appetite 2 2 3   Feeling bad or failure about yourself  1 1 0  Trouble concentrating 2 0 0  Moving slowly or fidgety/restless 1 0 0  Suicidal thoughts 0 0 0  PHQ-9 Score 10 5  4    Difficult doing work/chores Extremely dIfficult Somewhat difficult Somewhat difficult     Data saved with a previous flowsheet row definition       09/08/2024    9:25  AM 11/28/2023    9:31 AM 10/31/2023   10:08 AM 04/30/2023   10:53 AM  GAD 7 : Generalized Anxiety Score  Nervous, Anxious, on Edge 2 1 1  0  Control/stop worrying 2 1 2  0  Worry too much - different things 2 1 2  0  Trouble relaxing 3 1 2  0  Restless 3 1  0  Easily annoyed or irritable 3 0 0 0  Afraid - awful might happen 2 0 0 0  Total GAD 7 Score 17 5  0  Anxiety Difficulty Extremely difficult Somewhat difficult Somewhat difficult Not difficult at all     Relevant past medical, surgical, family and social history reviewed and updated as indicated. Interim medical history since our last visit reviewed. Allergies and medications reviewed and updated.  Review of Systems  Ten systems reviewed and is negative except as mentioned in HPI       Objective:      BP 132/88   Pulse 83   Temp 97.6 F (36.4 C)   Ht 5' 4 (1.626 m)   Wt 288 lb (130.6 kg)   LMP 08/07/2024   SpO2 97%   BMI 49.44 kg/m    Wt Readings from Last 3 Encounters:  09/08/24 288 lb (130.6 kg)  08/23/24 290 lb 3.2 oz (131.6 kg)  11/28/23 272 lb 12.8 oz (123.7 kg)    Physical Exam MEASUREMENTS: Weight- 288, BMI- 49.4. GENERAL: Alert, cooperative, well developed, no acute distress HEENT: Normocephalic, normal oropharynx, moist mucous membranes CHEST: Clear to auscultation bilaterally, No wheezes, rhonchi, or crackles CARDIOVASCULAR: Normal heart rate and rhythm, S1 and S2 normal without murmurs ABDOMEN: Soft, non-tender, non-distended, without organomegaly, Normal bowel sounds EXTREMITIES: No cyanosis or edema NEUROLOGICAL: Cranial nerves grossly intact, Moves all extremities without gross motor or sensory deficit  Results for orders placed or performed in visit on 08/23/24  HCV RNA quant   Collection Time: 08/23/24 10:49 AM  Result Value Ref Range   Hepatitis C Quantitation HCV Not Detected IU/mL   Test Information Comment   Hepatic function panel   Collection Time: 08/23/24 10:49 AM  Result Value Ref Range   Total Protein 7.2 6.0 - 8.5 g/dL   Albumin 3.8 (L) 3.9 - 4.9 g/dL   Bilirubin Total 0.3 0.0 - 1.2 mg/dL   Bilirubin, Direct <9.91 0.00 - 0.40 mg/dL   Alkaline Phosphatase 95 41 - 116 IU/L   AST 14 0 - 40 IU/L   ALT 9 0 - 32 IU/L          Assessment & Plan:   Problem List Items Addressed This Visit       Endocrine   Subclinical hypothyroidism - Primary   Relevant Orders   Thyroid  Panel With TSH     Musculoskeletal and Integument   Hidradenitis suppurativa   Relevant Medications   clindamycin  (CLINDAGEL) 1 % gel     Other   Prediabetes   Relevant Orders   Comprehensive metabolic panel with GFR   Hemoglobin A1c   Moderate episode of recurrent major depressive disorder (HCC)   Relevant Medications   escitalopram   (LEXAPRO ) 20 MG tablet   Anxiety   Relevant Medications   escitalopram  (LEXAPRO ) 20 MG tablet   Class 2 severe obesity due to excess calories with serious comorbidity and body mass index (BMI) of 39.0 to 39.9 in adult   Relevant Medications   WEGOVY  0.25 MG/0.5ML SOAJ SQ injection   Other Visit Diagnoses  Screening for deficiency anemia       Relevant Orders   CBC with Differential/Platelet     Screening for cholesterol level       Relevant Orders   Lipid panel        Assessment and Plan Assessment & Plan Obesity BMI of 49.4 and weight of 288 pounds. Difficulty losing weight despite exercise and dietary efforts. Potential contribution from subclinical hypothyroidism. Discussed GLP-1 medications for weight loss, including Wegovy , Zepbound , Ozempic, and Mounjaro. Discussed potential side effects including abdominal pain, nausea, constipation, and fatigue. Emphasized the importance of diet and exercise alongside medication. Discussed insurance coverage and cost considerations, including potential for oral Wegovy  if injectables are not covered. - Ordered blood work including CBC, CMP, TSH, A1c, and lipid panel - Prescribed Wegovy  for weight loss, pending insurance approval - Provided dietary guidance with a target of 1600-1800 calories per day - Scheduled follow-up in three months for weight loss medication check-up - Encourage continuation of lifestyle modifications, including dietary management and regular exercise. -continue to increase physical activity, getting at least 150 min of physical activity a week.  Work on including runner, broadcasting/film/video 2 days a week.  - continue eating at a calorie deficit 1600-1700 cal a day, eating a well balanced diet with whole foods, avoiding processed foods.   Patient is motivated to continue working on lifestyle modification.    Subclinical hypothyroidism Elevated TSH of 5.09 noted in February. Symptoms suggestive of progression to clinical  hypothyroidism, including weight gain and mood changes. Plan to recheck TSH and consider starting thyroid  medication if TSH remains elevated. - Ordered TSH as part of blood work - Will consider starting thyroid  medication if TSH remains elevated  Major depressive disorder, recurrent Reports feeling irritable by the end of the day and feeling depressed. Current Lexapro  dose of 10 mg may be insufficient. Plan to increase dose to 20 mg to address symptoms. Discussed potential impact of thyroid  function on mood. - Increased Lexapro  to 20 mg daily - Ordered blood work to assess thyroid  function  Anxiety disorder Reports increased anxiety, potentially exacerbated by weight issues and medication side effects. Phentermine and Contrave not recommended due to potential to increase anxiety. Discussed GLP-1 medications as a safer alternative for weight loss. - Avoided prescribing phentermine and Contrave due to potential anxiety exacerbation  Hidradenitis suppurativa Recurrent sores under the breast, belly, and arm, consistent with hidradenitis suppurativa. Painful sores noted, particularly in the belly area. Discussed use of clindamycin  gel to manage symptoms. - Prescribed clindamycin  gel to apply twice daily to affected areas  General health maintenance Discussed the importance of diet and exercise in conjunction with weight loss medication. Emphasized the need for a balanced diet and tracking caloric intake. - Provided dietary guidance and printout of diet information - Encouraged regular exercise and dietary tracking        Follow up plan: Return in about 3 months (around 12/07/2024) for follow up. "

## 2024-09-08 NOTE — Patient Instructions (Addendum)
 Healthy Weight Loss Guide -1600-1800 calories diet ?? Weight Loss Goal - Aim for 1-2 pounds per week - Target: 5-10% of your starting body weight over 3-6 months ??? Nutrition Tips - Eat 3 meals per day and avoid skipping meals - Fill half your plate with vegetables, a quarter with protein, a quarter with whole grains - Choose lean proteins: chicken, fish, eggs, tofu, beans - Limit: - Sugary drinks (soda, sweet tea, juice) - Fried foods and fast food - Processed snacks (chips, candy, cookies) - Drink at least 64 oz of water per day - Practice portion control and mindful eating ???? Lifestyle Habits - Track what you eat (apps like MyFitnessPal, Lose It!, or a paper log) - Get 7-9 hours of sleep per night - Manage stress (meditation, breathing exercises, counseling if needed) - Limit alcohol (empty calories and may increase hunger) ???? Exercise Recommendations - Goal: 150 minutes per week of moderate activity (e.g., brisk walking, cycling) - Start with 10-15 minutes/day and build up gradually - Add 2 days per week of strength training (light weights, resistance bands, or bodyweight) ?? Remember: Progress > Perfection Small changes every day add up. Don't give up! - Avoid high-fat or greasy foods to reduce nausea - Focus on protein at each meal to preserve muscle mass - Stay well hydrated (at least 64 oz water per day) - Limit sugar and processed carbohydrates ?? Managing Side Effects if on weight loss medication - Eat slowly and stop eating when you feel full - Use anti-nausea strategies: ginger tea, peppermint, crackers - Talk to your provider about adjusting the dose if needed - Stool softeners or fiber supplements can help with constipation ?? Staying on Track - Track weight and non-scale victories (energy, clothing fit, labs) - Follow up with your provider regularly - Don't stop medication without medical guidance - Combine medication with healthy habits for best results ??  Remember Weight loss medications are a tool, not a shortcut. Healthy habits matter. Be patient and consistent--small changes lead to big results.

## 2024-09-08 NOTE — Telephone Encounter (Signed)
 Pharmacy Patient Advocate Encounter   Received notification from Onbase that prior authorization for Wegovy  0.25MG /0.5ML auto-injectors is required/requested.   Insurance verification completed.   The patient is insured through [facility].   Per test claim: PA required; PA started via CoverMyMeds. [id number] . Waiting for clinical questions to populate.

## 2024-09-09 LAB — COMPREHENSIVE METABOLIC PANEL WITH GFR
AG Ratio: 1 (calc) (ref 1.0–2.5)
ALT: 6 U/L (ref 6–29)
AST: 11 U/L (ref 10–30)
Albumin: 3.6 g/dL (ref 3.6–5.1)
Alkaline phosphatase (APISO): 88 U/L (ref 31–125)
BUN: 15 mg/dL (ref 7–25)
CO2: 26 mmol/L (ref 20–32)
Calcium: 9.1 mg/dL (ref 8.6–10.2)
Chloride: 107 mmol/L (ref 98–110)
Creat: 0.59 mg/dL (ref 0.50–0.97)
Globulin: 3.5 g/dL (ref 1.9–3.7)
Glucose, Bld: 119 mg/dL — ABNORMAL HIGH (ref 65–99)
Potassium: 4.4 mmol/L (ref 3.5–5.3)
Sodium: 138 mmol/L (ref 135–146)
Total Bilirubin: 0.3 mg/dL (ref 0.2–1.2)
Total Protein: 7.1 g/dL (ref 6.1–8.1)
eGFR: 119 mL/min/1.73m2

## 2024-09-09 LAB — CBC WITH DIFFERENTIAL/PLATELET
Absolute Lymphocytes: 2484 {cells}/uL (ref 850–3900)
Absolute Monocytes: 454 {cells}/uL (ref 200–950)
Basophils Absolute: 29 {cells}/uL (ref 0–200)
Basophils Relative: 0.4 %
Eosinophils Absolute: 194 {cells}/uL (ref 15–500)
Eosinophils Relative: 2.7 %
HCT: 36.9 % (ref 35.9–46.0)
Hemoglobin: 12.2 g/dL (ref 11.7–15.5)
MCH: 28.9 pg (ref 27.0–33.0)
MCHC: 33.1 g/dL (ref 31.6–35.4)
MCV: 87.4 fL (ref 81.4–101.7)
MPV: 10.5 fL (ref 7.5–12.5)
Monocytes Relative: 6.3 %
Neutro Abs: 4039 {cells}/uL (ref 1500–7800)
Neutrophils Relative %: 56.1 %
Platelets: 346 Thousand/uL (ref 140–400)
RBC: 4.22 Million/uL (ref 3.80–5.10)
RDW: 13 % (ref 11.0–15.0)
Total Lymphocyte: 34.5 %
WBC: 7.2 Thousand/uL (ref 3.8–10.8)

## 2024-09-09 LAB — THYROID PANEL WITH TSH
Free Thyroxine Index: 2.2 (ref 1.4–3.8)
T3 Uptake: 31 % (ref 22–35)
T4, Total: 7.1 ug/dL (ref 5.1–11.9)
TSH: 5.33 m[IU]/L — ABNORMAL HIGH

## 2024-09-09 LAB — LIPID PANEL
Cholesterol: 176 mg/dL
HDL: 44 mg/dL — ABNORMAL LOW
LDL Cholesterol (Calc): 110 mg/dL — ABNORMAL HIGH
Non-HDL Cholesterol (Calc): 132 mg/dL — ABNORMAL HIGH
Total CHOL/HDL Ratio: 4 (calc)
Triglycerides: 111 mg/dL

## 2024-09-09 LAB — HEMOGLOBIN A1C
Hgb A1c MFr Bld: 5.8 % — ABNORMAL HIGH
Mean Plasma Glucose: 120 mg/dL
eAG (mmol/L): 6.6 mmol/L

## 2024-09-10 ENCOUNTER — Other Ambulatory Visit (HOSPITAL_COMMUNITY): Payer: Self-pay

## 2024-09-10 ENCOUNTER — Ambulatory Visit: Payer: Self-pay | Admitting: Nurse Practitioner

## 2024-09-10 ENCOUNTER — Telehealth: Payer: Self-pay | Admitting: Pharmacy Technician

## 2024-09-10 DIAGNOSIS — E038 Other specified hypothyroidism: Secondary | ICD-10-CM

## 2024-09-10 DIAGNOSIS — Z6839 Body mass index (BMI) 39.0-39.9, adult: Secondary | ICD-10-CM

## 2024-09-10 MED ORDER — ZEPBOUND 2.5 MG/0.5ML ~~LOC~~ SOAJ: 2.5000 mg | SUBCUTANEOUS | 0 refills | Status: AC

## 2024-09-10 MED ORDER — LEVOTHYROXINE SODIUM 50 MCG PO TABS: 50.0000 ug | ORAL_TABLET | Freq: Every day | ORAL | 0 refills | Status: DC

## 2024-09-10 NOTE — Telephone Encounter (Signed)
 Pharmacy Patient Advocate Encounter   Received notification from Onbase that prior authorization for Zepbound  2.5 mg/0.5ml pen is required/requested.   Insurance verification completed.   The patient is insured through [employer].   Per test claim: Per test claim, medication is not covered due to plan/benefit exclusion, PA not submitted at this time

## 2024-10-13 ENCOUNTER — Encounter: Payer: Self-pay | Admitting: Nurse Practitioner

## 2024-10-13 ENCOUNTER — Other Ambulatory Visit: Payer: Self-pay | Admitting: Nurse Practitioner

## 2024-10-13 DIAGNOSIS — E038 Other specified hypothyroidism: Secondary | ICD-10-CM

## 2024-10-13 MED ORDER — THYROID 30 MG PO TABS: 30.0000 mg | ORAL_TABLET | Freq: Every day | ORAL | 0 refills | Status: AC

## 2024-12-07 ENCOUNTER — Ambulatory Visit: Admitting: Nurse Practitioner
